# Patient Record
Sex: Female | Born: 1935 | Race: White | Hispanic: No | Smoking: Never smoker
Health system: Southern US, Community
[De-identification: ages and names within clinical notes are randomized; demographics above are authoritative.]

## PROBLEM LIST (undated history)

## (undated) DIAGNOSIS — F0390 Unspecified dementia without behavioral disturbance: Secondary | ICD-10-CM

## (undated) HISTORY — DX: Unspecified dementia, unspecified severity, without behavioral disturbance, psychotic disturbance, mood disturbance, and anxiety: F03.90

---

## 2015-08-23 ENCOUNTER — Ambulatory Visit (INDEPENDENT_AMBULATORY_CARE_PROVIDER_SITE_OTHER): Payer: Medicare Other | Admitting: Orthopaedic Surgery

## 2015-08-23 ENCOUNTER — Encounter: Payer: Self-pay | Admitting: Orthopaedic Surgery

## 2015-08-23 VITALS — BP 191/80 | HR 84 | Ht 64.0 in | Wt 142.8 lb

## 2015-08-23 DIAGNOSIS — S22000A Wedge compression fracture of unspecified thoracic vertebra, initial encounter for closed fracture: Secondary | ICD-10-CM

## 2015-08-23 DIAGNOSIS — M8448XA Pathological fracture, other site, initial encounter for fracture: Secondary | ICD-10-CM

## 2015-08-23 MED ORDER — HYDROCODONE-ACETAMINOPHEN 5-325 MG PO TABS: 1.0000 | ORAL_TABLET | Freq: Four times a day (QID) | ORAL | Status: DC | PRN

## 2015-08-23 NOTE — Patient Instructions (Addendum)
You have a prescription for a brace, take it to [facility] to get fitted for the brace She will wear the brace for 3 months

## 2015-08-23 NOTE — Progress Notes (Signed)
Patient Tracy Mccoy, female DOB:08-14-1922, 82 y.o. YNW:[id number]  Chief Complaint  Patient presents with  . New Patient (Initial Visit)    compression fracture of thoracic spine    HPI  Tracy Mccoy is a 82 y.o. female who fell out of bed two days ago.  She had immediate pain in the lower back area.  She was seen at Urgent Care.  I have the CD of the x-rays showing an acute compression fracture of T12.  She has no other injury, no weakness or numbness.  She hurts.   Back Pain This is a new problem. The current episode started in the past 7 days. The problem occurs constantly. The problem has been gradually worsening since onset. The pain is present in the thoracic spine. The quality of the pain is described as aching, burning, shooting and stabbing. The pain does not radiate. The pain is at a severity of 6/10. The pain is moderate. The pain is the same all the time. The symptoms are aggravated by bending, coughing, standing and twisting. She has tried bed rest for the symptoms. The treatment provided no relief.    Body mass index is 24.5 kg/(m^2).  Review of Systems  Patient does not have Diabetes Mellitus. Patient does not have hypertension. Patient does not have COPD or shortness of breath. Patient does not have BMI > 35. Patient does not have current smoking history.  Review of Systems  HENT: Positive for hearing loss.   Musculoskeletal: Positive for back pain.  Psychiatric/Behavioral: The patient is nervous/anxious.   All other systems reviewed and are negative.   Past Medical History  Diagnosis Date  . Dementia     History reviewed. No pertinent past surgical history.  History reviewed. No pertinent family history.  Social History Social History  Substance Use Topics  . Smoking status: Never Smoker   . Smokeless tobacco: None  . Alcohol Use: No    No Known Allergies  Current Outpatient Prescriptions  Medication Sig Dispense Refill  . donepezil (ARICEPT) 5  MG tablet Take 5 mg by mouth at bedtime.    Marland Kitchen HYDROcodone-acetaminophen (NORCO/VICODIN) 5-325 MG tablet Take 1 tablet by mouth every 6 (six) hours as needed for moderate pain. 60 tablet 0  . zolpidem (AMBIEN) 5 MG tablet Take 5 mg by mouth at bedtime as needed for sleep.     No current facility-administered medications for this visit.     Physical Exam  Blood pressure 191/80, pulse 84, height  (1.626 m), weight 142 lb 12.8 oz (64.774 kg).  Constitutional: overall normal hygiene, normal nutrition, well developed, normal grooming, normal body habitus. Assistive device:none  Musculoskeletal: gait and station Limp none, muscle tone and strength are normal, no tremors or atrophy is present.  .  Neurological: coordination overall normal.  Deep tendon reflex/nerve stretch intact.  Sensation normal.  Cranial nerves II-XII intact.   Skin:normal overall no scars, lesions, ulcers or rash es. No psoriasis.  Psychiatric: Alert and oriented x 3.  Recent memory intact, remote memory unclear.  Normal mood and affect. Well groomed.  Good eye contact.  Cardiovascular: overall no swelling, no varicosities, no edema bilaterally, normal temperatures of the legs and arms, no clubbing, cyanosis and good capillary refill.  Lymphatic: palpation is normal.   Extremities:she has no pain to the extremities but has pain in the mid back more at the end of the thoracic spine.  She has no spasm.  NV is intact.   Inspection pain of the mid  back Strength and tone normal Range of motion very limited of the back with 0 extension, 5 flexion and pain, lateral bend both sides cannot be done secondary to pain  Additional services performed: ordered a CASH brace for her.  I talked to her family members and her as to its use and application.  They will obtain this at a local pharmacy.  She is very hard of hearing and I made sure her family understood.  She is amazingly healthy for a person her age.  PLAN Call if any  problems.  Precautions discussed.  Continue current medications.   Return to clinic one week with examination of the CASH brace.  She may need new x-rays depending on how she is doing.

## 2015-08-27 ENCOUNTER — Emergency Department (HOSPITAL_COMMUNITY): Payer: Medicare Other

## 2015-08-27 ENCOUNTER — Inpatient Hospital Stay (HOSPITAL_COMMUNITY)
Admission: EM | Admit: 2015-08-27 | Discharge: 2015-09-01 | DRG: 193 | Disposition: A | Discharge status: Skilled Nursing Facility | Payer: Medicare Other | Attending: Internal Medicine | Admitting: Internal Medicine

## 2015-08-27 DIAGNOSIS — W19XXXD Unspecified fall, subsequent encounter: Secondary | ICD-10-CM

## 2015-08-27 DIAGNOSIS — W1830XA Fall on same level, unspecified, initial encounter: Secondary | ICD-10-CM | POA: Diagnosis present

## 2015-08-27 DIAGNOSIS — M4854XA Collapsed vertebra, not elsewhere classified, thoracic region, initial encounter for fracture: Secondary | ICD-10-CM | POA: Diagnosis present

## 2015-08-27 DIAGNOSIS — Y92002 Bathroom of unspecified non-institutional (private) residence single-family (private) house as the place of occurrence of the external cause: Secondary | ICD-10-CM

## 2015-08-27 DIAGNOSIS — M4854XS Collapsed vertebra, not elsewhere classified, thoracic region, sequela of fracture: Secondary | ICD-10-CM | POA: Diagnosis not present

## 2015-08-27 DIAGNOSIS — Y92009 Unspecified place in unspecified non-institutional (private) residence as the place of occurrence of the external cause: Secondary | ICD-10-CM | POA: Diagnosis not present

## 2015-08-27 DIAGNOSIS — R471 Dysarthria and anarthria: Secondary | ICD-10-CM | POA: Diagnosis present

## 2015-08-27 DIAGNOSIS — Z7982 Long term (current) use of aspirin: Secondary | ICD-10-CM

## 2015-08-27 DIAGNOSIS — G9340 Encephalopathy, unspecified: Secondary | ICD-10-CM | POA: Diagnosis not present

## 2015-08-27 DIAGNOSIS — T426X5A Adverse effect of other antiepileptic and sedative-hypnotic drugs, initial encounter: Secondary | ICD-10-CM | POA: Diagnosis present

## 2015-08-27 DIAGNOSIS — G92 Toxic encephalopathy: Secondary | ICD-10-CM | POA: Diagnosis present

## 2015-08-27 DIAGNOSIS — R9431 Abnormal electrocardiogram [ECG] [EKG]: Secondary | ICD-10-CM | POA: Diagnosis present

## 2015-08-27 DIAGNOSIS — M4854XG Collapsed vertebra, not elsewhere classified, thoracic region, subsequent encounter for fracture with delayed healing: Secondary | ICD-10-CM | POA: Diagnosis not present

## 2015-08-27 DIAGNOSIS — J189 Pneumonia, unspecified organism: Secondary | ICD-10-CM | POA: Diagnosis not present

## 2015-08-27 DIAGNOSIS — R4182 Altered mental status, unspecified: Secondary | ICD-10-CM | POA: Diagnosis not present

## 2015-08-27 DIAGNOSIS — R29810 Facial weakness: Secondary | ICD-10-CM | POA: Diagnosis present

## 2015-08-27 DIAGNOSIS — F0390 Unspecified dementia without behavioral disturbance: Secondary | ICD-10-CM | POA: Diagnosis present

## 2015-08-27 DIAGNOSIS — M4854XD Collapsed vertebra, not elsewhere classified, thoracic region, subsequent encounter for fracture with routine healing: Secondary | ICD-10-CM | POA: Diagnosis not present

## 2015-08-27 DIAGNOSIS — E876 Hypokalemia: Secondary | ICD-10-CM | POA: Diagnosis present

## 2015-08-27 DIAGNOSIS — I639 Cerebral infarction, unspecified: Secondary | ICD-10-CM

## 2015-08-27 DIAGNOSIS — F0391 Unspecified dementia with behavioral disturbance: Secondary | ICD-10-CM

## 2015-08-27 DIAGNOSIS — S22080A Wedge compression fracture of T11-T12 vertebra, initial encounter for closed fracture: Secondary | ICD-10-CM | POA: Diagnosis present

## 2015-08-27 DIAGNOSIS — W19XXXA Unspecified fall, initial encounter: Secondary | ICD-10-CM | POA: Diagnosis not present

## 2015-08-27 DIAGNOSIS — T391X5A Adverse effect of 4-Aminophenol derivatives, initial encounter: Secondary | ICD-10-CM | POA: Diagnosis present

## 2015-08-27 LAB — COMPREHENSIVE METABOLIC PANEL
ALK PHOS: 61 U/L (ref 38–126)
ALT: 24 U/L (ref 14–54)
ANION GAP: 13 (ref 5–15)
AST: 28 U/L (ref 15–41)
Albumin: 3.4 g/dL — ABNORMAL LOW (ref 3.5–5.0)
BILIRUBIN TOTAL: 0.3 mg/dL (ref 0.3–1.2)
BUN: 20 mg/dL (ref 6–20)
CALCIUM: 9.5 mg/dL (ref 8.9–10.3)
CO2: 27 mmol/L (ref 22–32)
Chloride: 105 mmol/L (ref 101–111)
Creatinine, Ser: 0.84 mg/dL (ref 0.44–1.00)
GFR calc non Af Amer: 59 mL/min — ABNORMAL LOW (ref >60)
Glucose, Bld: 79 mg/dL (ref 65–99)
Potassium: 3.7 mmol/L (ref 3.5–5.1)
Sodium: 145 mmol/L (ref 135–145)
TOTAL PROTEIN: 7.5 g/dL (ref 6.5–8.1)

## 2015-08-27 LAB — I-STAT TROPONIN, ED: Troponin i, poc: 0 ng/mL (ref 0.00–0.08)

## 2015-08-27 LAB — CBC WITH DIFFERENTIAL/PLATELET
Basophils Absolute: 0 K/uL (ref 0.0–0.1)
Basophils Relative: 0 %
Eosinophils Absolute: 0.3 K/uL (ref 0.0–0.7)
Eosinophils Relative: 5 %
HCT: 40.2 % (ref 36.0–46.0)
Hemoglobin: 12.8 g/dL (ref 12.0–15.0)
Lymphocytes Relative: 37 %
Lymphs Abs: 2.1 K/uL (ref 0.7–4.0)
MCH: 29.8 pg (ref 26.0–34.0)
MCHC: 31.8 g/dL (ref 30.0–36.0)
MCV: 93.7 fL (ref 78.0–100.0)
Monocytes Absolute: 0.8 K/uL (ref 0.1–1.0)
Monocytes Relative: 14 %
Neutro Abs: 2.4 K/uL (ref 1.7–7.7)
Neutrophils Relative %: 44 %
Platelets: 257 K/uL (ref 150–400)
RBC: 4.29 MIL/uL (ref 3.87–5.11)
RDW: 12.9 % (ref 11.5–15.5)
WBC: 5.6 K/uL (ref 4.0–10.5)

## 2015-08-27 LAB — URINALYSIS, ROUTINE W REFLEX MICROSCOPIC
Bilirubin Urine: NEGATIVE
Glucose, UA: NEGATIVE mg/dL
Hgb urine dipstick: NEGATIVE
KETONES UR: NEGATIVE mg/dL
LEUKOCYTES UA: NEGATIVE
NITRITE: NEGATIVE
PROTEIN: NEGATIVE mg/dL
Specific Gravity, Urine: 1.017 (ref 1.005–1.030)
pH: 5.5 (ref 5.0–8.0)

## 2015-08-27 MED ORDER — STROKE: EARLY STAGES OF RECOVERY BOOK
Freq: Once | Status: AC
  Administered 2015-08-28
  Filled 2015-08-27: qty 1

## 2015-08-27 MED ORDER — ASPIRIN EC 81 MG PO TBEC
81.0000 mg | DELAYED_RELEASE_TABLET | Freq: Every day | ORAL | Status: DC
  Administered 2015-08-28 – 2015-09-01 (×5): 81 mg via ORAL
  Filled 2015-08-27 (×5): qty 1

## 2015-08-27 MED ORDER — ENOXAPARIN SODIUM 40 MG/0.4ML ~~LOC~~ SOLN
40.0000 mg | Freq: Every day | SUBCUTANEOUS | Status: DC
  Administered 2015-08-28 – 2015-08-31 (×5): 40 mg via SUBCUTANEOUS
  Filled 2015-08-27 (×6): qty 0.4

## 2015-08-27 MED ORDER — DEXTROSE 5 % IV SOLN
1.0000 g | Freq: Once | INTRAVENOUS | Status: AC
  Administered 2015-08-27: 1 g via INTRAVENOUS
  Filled 2015-08-27: qty 10

## 2015-08-27 MED ORDER — HYDROCODONE-ACETAMINOPHEN 5-325 MG PO TABS
1.0000 | ORAL_TABLET | Freq: Four times a day (QID) | ORAL | Status: DC | PRN
  Administered 2015-08-28 (×4): 1 via ORAL
  Filled 2015-08-27 (×5): qty 1

## 2015-08-27 MED ORDER — DEXTROSE 5 % IV SOLN
500.0000 mg | Freq: Once | INTRAVENOUS | Status: AC
  Administered 2015-08-27: 500 mg via INTRAVENOUS
  Filled 2015-08-27: qty 500

## 2015-08-27 NOTE — ED Provider Notes (Signed)
CSN: [id number]     Arrival date & time 08/27/15  1944 History   First MD Initiated Contact with Patient 08/27/15 1958     Chief Complaint  Patient presents with  . Fall     (Consider location/radiation/quality/duration/timing/severity/associated sxs/prior Treatment) HPI Comments: Patient presents to the emergency department with chief complaint of failure to thrive. Unable to obtain history from patient secondary to dementia. Level V caveat applies. Reportedly, the patient had a mechanical fall on February 14. Since then, she has not been eating or drinking normally per the family.  The history is provided by the patient. No language interpreter was used.    Past Medical History  Diagnosis Date  . Dementia    No past surgical history on file. No family history on file. Social History  Substance Use Topics  . Smoking status: Never Smoker   . Smokeless tobacco: Not on file  . Alcohol Use: No   OB History    No data available     Review of Systems  Unable to perform ROS: Dementia      Allergies  Review of patient's allergies indicates no known allergies.  Home Medications   Prior to Admission medications   Medication Sig Start Date End Date Taking? Authorizing Provider  donepezil (ARICEPT) 5 MG tablet Take 5 mg by mouth at bedtime.    Historical Provider, MD  HYDROcodone-acetaminophen (NORCO/VICODIN) 5-325 MG tablet Take 1 tablet by mouth every 6 (six) hours as needed for moderate pain. 08/23/15   Darreld Mclean, MD  zolpidem (AMBIEN) 5 MG tablet Take 5 mg by mouth at bedtime as needed for sleep.    Historical Provider, MD   BP 179/74 mmHg  Pulse 77  Temp(Src) 98.5 F (36.9 C) (Rectal)  Resp 18  Wt 64.411 kg  SpO2 97% Physical Exam  Constitutional: She is oriented to person, place, and time. She appears well-developed and well-nourished.  HENT:  Head: Normocephalic and atraumatic.  Eyes: Conjunctivae and EOM are normal. Pupils are equal, round, and reactive to  light.  Neck: Normal range of motion. Neck supple.  Cardiovascular: Normal rate and regular rhythm.  Exam reveals no gallop and no friction rub.   No murmur heard. Pulmonary/Chest: Effort normal and breath sounds normal. No respiratory distress. She has no wheezes. She has no rales. She exhibits no tenderness.  Abdominal: Soft. Bowel sounds are normal. She exhibits no distension and no mass. There is no tenderness. There is no rebound and no guarding.  Musculoskeletal: Normal range of motion. She exhibits no edema or tenderness.  Neurological: She is alert and oriented to person, place, and time.  Skin: Skin is warm and dry.  Psychiatric: She has a normal mood and affect. Her behavior is normal. Judgment and thought content normal.  Nursing note and vitals reviewed.   ED Course  Procedures (including critical care time) Results for orders placed or performed during the hospital encounter of 08/27/15  CBC with Differential/Platelet  Result Value Ref Range   WBC 5.6 4.0 - 10.5 K/uL   RBC 4.29 3.87 - 5.11 MIL/uL   Hemoglobin 12.8 12.0 - 15.0 g/dL   HCT 56.2 13.0 - 86.5 %   MCV 93.7 78.0 - 100.0 fL   MCH 29.8 26.0 - 34.0 pg   MCHC 31.8 30.0 - 36.0 g/dL   RDW 78.4 69.6 - 29.5 %   Platelets 257 150 - 400 K/uL   Neutrophils Relative % 44 %   Neutro Abs 2.4 1.7 - 7.7  K/uL   Lymphocytes Relative 37 %   Lymphs Abs 2.1 0.7 - 4.0 K/uL   Monocytes Relative 14 %   Monocytes Absolute 0.8 0.1 - 1.0 K/uL   Eosinophils Relative 5 %   Eosinophils Absolute 0.3 0.0 - 0.7 K/uL   Basophils Relative 0 %   Basophils Absolute 0.0 0.0 - 0.1 K/uL  Comprehensive metabolic panel  Result Value Ref Range   Sodium 145 135 - 145 mmol/L   Potassium 3.7 3.5 - 5.1 mmol/L   Chloride 105 101 - 111 mmol/L   CO2 27 22 - 32 mmol/L   Glucose, Bld 79 65 - 99 mg/dL   BUN 20 6 - 20 mg/dL   Creatinine, Ser 4.09 0.44 - 1.00 mg/dL   Calcium 9.5 8.9 - 81.1 mg/dL   Total Protein 7.5 6.5 - 8.1 g/dL   Albumin 3.4 (L) 3.5  - 5.0 g/dL   AST 28 15 - 41 U/L   ALT 24 14 - 54 U/L   Alkaline Phosphatase 61 38 - 126 U/L   Total Bilirubin 0.3 0.3 - 1.2 mg/dL   GFR calc non Af Amer 59 (L) >60 mL/min   GFR calc Af Amer >60 >60 mL/min   Anion gap 13 5 - 15  Urinalysis, Routine w reflex microscopic (not at [facility])  Result Value Ref Range   Color, Urine YELLOW YELLOW   APPearance CLEAR CLEAR   Specific Gravity, Urine 1.017 1.005 - 1.030   pH 5.5 5.0 - 8.0   Glucose, UA NEGATIVE NEGATIVE mg/dL   Hgb urine dipstick NEGATIVE NEGATIVE   Bilirubin Urine NEGATIVE NEGATIVE   Ketones, ur NEGATIVE NEGATIVE mg/dL   Protein, ur NEGATIVE NEGATIVE mg/dL   Nitrite NEGATIVE NEGATIVE   Leukocytes, UA NEGATIVE NEGATIVE  I-Stat Troponin, ED (not at [facility])  Result Value Ref Range   Troponin i, poc 0.00 0.00 - 0.08 ng/mL   Comment 3           Ct Head Wo Contrast  08/27/2015  CLINICAL DATA:  Dementia. Patient fell on the fourteenth at home and since then has had decreased appetite. Altered mental status. EXAM: CT HEAD WITHOUT CONTRAST TECHNIQUE: Contiguous axial images were obtained from the base of the skull through the vertex without intravenous contrast. COMPARISON:  None. FINDINGS: Diffuse cerebral atrophy. Mild ventricular dilatation consistent with central atrophy. Low-attenuation changes in the deep white matter consistent with small vessel ischemia. Somewhat hazy appearance of the right posterior frontal sulci at the convexity. This may be due to motion artifact but early changes of acute infarct are not excluded. No mass effect or midline shift. No abnormal extra-axial fluid collections. Gray-white matter junctions are distinct. Basal cisterns are not effaced. No evidence of acute intracranial hemorrhage. No depressed skull fractures. Visualized paranasal sinuses and mastoid air cells are not opacified. Vascular calcifications. IMPRESSION: Chronic atrophy and small vessel ischemic changes. Nonspecific changes in the right posterior  frontal convexity may represent artifact but early changes of acute infarct are not excluded. No acute intracranial hemorrhage. No significant mass effect. Electronically Signed   By: Burman Nieves M.D.   On: 08/27/2015 21:28   Dg Chest Port 1 View  08/27/2015  CLINICAL DATA:  Patient fell February 14th at home with decreased appetite since then, current history of thoracic compression deformities EXAM: PORTABLE CHEST 1 VIEW COMPARISON:  08/23/2015 FINDINGS: Moderate cardiac enlargement with significant uncoiling of the aorta. Opacity right lower lobe suggesting consolidation and small effusion. Vascular pattern within normal limits.  No pleural effusion on the left. IMPRESSION: Right lower lobe airspace disease with small effusion. Pneumonia or pneumonitis suspected. Electronically Signed   By: Esperanza Heir M.D.   On: 08/27/2015 21:04    I have personally reviewed and evaluated these images and lab results as part of my medical decision-making.    MDM   Final diagnoses:  CAP (community acquired pneumonia)    Patient with reported altered mental status. Per the patient's son, she has not been communicating like normal. He has noticed this since she had a fall around Valentines, in which she had a T12 compression fracture. Will check labs, CT head, and will reassess.  Chest x-ray today remarkable for a right lower lobe pneumonia. Will treat for community-acquired pneumonia. Will admit to hospitalist service.  Appreciate Dr. Katrinka Blazing for admitting the patient.  Patient seen by and discussed with Dr. Verdie Mosher, who agrees with the plan.    Roxy Horseman, PA-C 08/27/15 2203  Lavera Guise, MD 08/28/15 [phone]

## 2015-08-27 NOTE — ED Notes (Addendum)
Pt arrives via [name], per report, the pt has dementia, lives alone with family who checks on her.  Pt fell on the 14th at home, and since then she has had decreased appetite/oral intake. She was seen at her doctor, has thoracic vertebrae compression fractures, pt is suppose to be wearing a brace for 3 months. Pt denied pain en route, family administered hydrocodone 3 hours PTA. Pt alert on arrival.

## 2015-08-27 NOTE — ED Notes (Signed)
Patient transported to CT 

## 2015-08-27 NOTE — ED Notes (Signed)
PA at bedside, speaking with pt son who is at the bedside.

## 2015-08-27 NOTE — ED Notes (Signed)
Delay in lab draw pt in exray 

## 2015-08-27 NOTE — ED Notes (Signed)
cxr at bedside

## 2015-08-27 NOTE — H&P (Addendum)
[facility] History and Physical  Neeva Trew ZOX:[id number] DOB: June 07, 1923 DOA: 08/27/2015  Referring physician: ED PCP: No primary care provider on file.   Chief Complaint: Altered  HPI:  Ms. Monfort is a 82 year old female with a past medical history significant for dementia; who presents with complaints of being altered. Patient's son gives history as she has significant dementia. Patient was noted to fall 5 days ago after missing the toilet will try to use the bathroom. She was taken to  a urgent care in which x-rays showed an acute compression fracture of T 12. She suffered no other injuries and was discharged home and given a back brace with pain medications. He notes that trying to move her anywhere causes her a great deal of back pain. Thereafter family noted that she has been basically bedbound. Family stated that her speech has been off and she was not acting like herself. Son shows video of the patient brushing her tongue with a hair brush. at baseline patient will answer questions appropriately if kept short.   Yesterday that her right side of her mouth seemed to be drooping, but it appeared to go away. Patient had been started on new medications including Donepeizil started approximately 2 weeks ago by Dr. Quentin Cornwall. Subsequently, after the fall the patient was started on hydrocodone and sober then she had not been sleeping well. Son denies any significant cough or any other complaints.   Upon admission the patient was evaluated with lab work which was unremarkable. Urinalysis negative, CT of the brain showed question of possible artifact versus acute infarct in the right frontal lobe, chest x-ray showing right lower lobe air space disease with a small effusion question possibility of pneumonia.   Review of Systems: negative for the following except for as noted above. Note patient has a caveat of dementia. Constitutional: Denies fever, chills, diaphoresis, appetite change and fatigue.   HEENT: Denies photophobia, eye pain, redness, hearing loss, ear pain, congestion, sore throat, rhinorrhea, sneezing, mouth sores, trouble swallowing, neck pain, neck stiffness and tinnitus.  Respiratory: Denies SOB, DOE, cough, chest tightness, and wheezing.  Cardiovascular: Denies chest pain, palpitations and leg swelling.  Gastrointestinal: Denies nausea, vomiting, abdominal pain, diarrhea, constipation, blood in stool and abdominal distention.  Genitourinary: Denies dysuria, urgency, frequency, hematuria, flank pain and difficulty urinating.  Musculoskeletal: Denies myalgias, joint swelling, and arthralgias.  Skin: Denies pallor, rash and wound.  Neurological: Denies dizziness, seizures, syncope, weakness, light-headedness, numbness and headaches.  Hematological: Denies adenopathy. Easy bruising, personal or family bleeding history  Psychiatric/behavior :   Past Medical History  Diagnosis Date  . Dementia      No past surgical history on file.    Social History:  reports that she has never smoked. She does not have any smokeless tobacco history on file. She reports that she does not drink alcohol or use illicit drugs. Where does patient live--home   and with whom if at home? alone prior to recent fall now a cousin staying with her  Can patient participate in ADLs? currently needing assistance  No Known Allergies  No family history on file.      Prior to Admission medications   Medication Sig Start Date End Date Taking? Authorizing Provider  aspirin EC 81 MG tablet Take 81 mg by mouth daily.   Yes Historical Provider, MD  donepezil (ARICEPT) 5 MG tablet Take 5 mg by mouth at bedtime.   Yes Historical Provider, MD  HYDROcodone-acetaminophen (NORCO/VICODIN) 5-325 MG tablet Take 1  tablet by mouth every 6 (six) hours as needed for moderate pain. 08/23/15  Yes Darreld Mclean, MD  Multiple Vitamin (MULTIVITAMIN WITH MINERALS) TABS tablet Take 1 tablet by mouth daily.   Yes  Historical Provider, MD  zolpidem (AMBIEN) 5 MG tablet Take 5 mg by mouth at bedtime as needed for sleep.   Yes Historical Provider, MD     Physical Exam: Filed Vitals:   08/27/15 2136 08/27/15 2200 08/27/15 2230 08/27/15 2336  BP: 171/75 157/85 128/57 164/66  Pulse: 70 74 73 76  Temp:    97.9 F (36.6 C)  TempSrc:    Axillary  Resp: Weight:      SpO2: 97% 98% 99% 97%     Constitutional: Vital signs reviewed. Patient is a well-developed and well-nourished in no acute distress and cooperative with exam with repeated redirecting. Alert, but appears somewhat confused. Head: Normocephalic and atraumatic  Ear: TM normal bilaterally  Mouth: no erythema or exudates, MMM  Eyes: PERRL, EOMI, conjunctivae normal, No scleral icterus.  Neck: Supple, Trachea midline normal ROM, No JVD, mass, thyromegaly, or carotid bruit present.  Cardiovascular: RRR, S1 normal, S2 normal, no MRG, pulses symmetric and intact bilaterally  Pulmonary/Chest: CTAB, no wheezes, rales, or rhonchi  Abdominal: Soft. Non-tender, non-distended, bowel sounds are normal, no masses, organomegaly, or guarding present.  GU: no CVA tenderness Musculoskeletal: Tenderness to palpation of the lower thoracic area back Ext: no edema and no cyanosis, pulses palpable bilaterally (DP and PT)  Hematology: no cervical, inginal, or axillary adenopathy.  Neurological: able to move all extremities subtle signs of right-sided facial droop Skin: Warm, dry and intact. No rash, cyanosis, or clubbing.  Psychiatric: Appears pleasantly confused      Data Review   Micro Results No results found for this or any previous visit (from the past 240 hour(s)).  Radiology Reports Ct Head Wo Contrast  08/27/2015  CLINICAL DATA:  Dementia. Patient fell on the fourteenth at home and since then has had decreased appetite. Altered mental status. EXAM: CT HEAD WITHOUT CONTRAST TECHNIQUE: Contiguous axial images were obtained from the base  of the skull through the vertex without intravenous contrast. COMPARISON:  None. FINDINGS: Diffuse cerebral atrophy. Mild ventricular dilatation consistent with central atrophy. Low-attenuation changes in the deep white matter consistent with small vessel ischemia. Somewhat hazy appearance of the right posterior frontal sulci at the convexity. This may be due to motion artifact but early changes of acute infarct are not excluded. No mass effect or midline shift. No abnormal extra-axial fluid collections. Gray-white matter junctions are distinct. Basal cisterns are not effaced. No evidence of acute intracranial hemorrhage. No depressed skull fractures. Visualized paranasal sinuses and mastoid air cells are not opacified. Vascular calcifications. IMPRESSION: Chronic atrophy and small vessel ischemic changes. Nonspecific changes in the right posterior frontal convexity may represent artifact but early changes of acute infarct are not excluded. No acute intracranial hemorrhage. No significant mass effect. Electronically Signed   By: Burman Nieves M.D.   On: 08/27/2015 21:28   Dg Chest Port 1 View  08/27/2015  CLINICAL DATA:  Patient fell February 14th at home with decreased appetite since then, current history of thoracic compression deformities EXAM: PORTABLE CHEST 1 VIEW COMPARISON:  08/23/2015 FINDINGS: Moderate cardiac enlargement with significant uncoiling of the aorta. Opacity right lower lobe suggesting consolidation and small effusion. Vascular pattern within normal limits. No pleural effusion on the left. IMPRESSION: Right lower lobe airspace disease with small effusion. Pneumonia  or pneumonitis suspected. Electronically Signed   By: Esperanza Heir M.D.   On: 08/27/2015 21:04     CBC  Recent Labs Lab 08/27/15 2038  WBC 5.6  HGB 12.8  HCT 40.2  PLT 257  MCV 93.7  MCH 29.8  MCHC 31.8  RDW 12.9  LYMPHSABS 2.1  MONOABS 0.8  EOSABS 0.3  BASOSABS 0.0    Chemistries   Recent Labs Lab  08/27/15 2038  NA 145  K 3.7  CL 105  CO2 27  GLUCOSE 79  BUN 20  CREATININE 0.84  CALCIUM 9.5  AST 28  ALT 24  ALKPHOS 61  BILITOT 0.3   ------------------------------------------------------------------------------------------------------------------ estimated creatinine clearance is 38.4 mL/min (by C-G formula based on Cr of 0.84). ------------------------------------------------------------------------------------------------------------------ No results for input(s): HGBA1C in the last 72 hours. ------------------------------------------------------------------------------------------------------------------ No results for input(s): CHOL, HDL, LDLCALC, TRIG, CHOLHDL, LDLDIRECT in the last 72 hours. ------------------------------------------------------------------------------------------------------------------ No results for input(s): TSH, T4TOTAL, T3FREE, THYROIDAB in the last 72 hours.  Invalid input(s): FREET3 ------------------------------------------------------------------------------------------------------------------ No results for input(s): VITAMINB12, FOLATE, FERRITIN, TIBC, IRON, RETICCTPCT in the last 72 hours.  Coagulation profile No results for input(s): INR, PROTIME in the last 168 hours.  No results for input(s): DDIMER in the last 72 hours.  Cardiac Enzymes No results for input(s): CKMB, TROPONINI, MYOGLOBIN in the last 168 hours.  Invalid input(s): CK ------------------------------------------------------------------------------------------------------------------ Invalid input(s): POCBNP   CBG: No results for input(s): GLUCAP in the last 168 hours.     EKG: Independently reviewed. sinus rhythm with low voltage minimum ST elevations in inferior leads   Assessment/Plan Acute encephalopathy: This is new per the family patient is acting oddly and has had a decline since recent fall. Chest x-ray signs of a possible right lower lobe pneumonia.  Workup otherwise negative except for CT which does not coincide with the right facial droop although note of rectus patient was moving. Question combination of recent meds including hydrocodone, Zolpidem, and donepezil being contributing factors. - Admit to a telemetry bed  - Neuro checks every 2 hours  - MRI/ MRA of the brain;  if positive consult neurology at that time. Spoke with Dr. Petra Kuba regarding patient and this was his recommendation. - Hold donepezil and Zolpidem  Possible CAP (community acquired pneumonia): As seen above. Patient not noted to have any cough, fever, or signs specific for acute infection. Chest x-ray revealed possible right lower lung airspace disease with a effusion question possibility of pneumonia. Patient has been less ambulatory since fall versus possible aspiration with patient's dysarthria. - Try to obtain sputum cultures  - empiric antibiotics of ceftriaxone and azithromycin    Dysarthria: Question if patient may have had TIA/stroke. Initial CT appears to be negative. Based off history patient would likely be out of the window for intervention. - Speech therapy to eval and treat   Compression fracture of T12 secondary to fall - PT/OT to eval and treat  - Continue hydrocodone  Dementia - Held Donepezil   Code Status:   full Family Communication: bedside Disposition Plan: admit   Total time spent 55 minutes.Greater than 50% of this time was spent in counseling, explanation of diagnosis, planning of further management, and coordination of care  Clydie Braun [facility] Pager [phone]  If 7PM-7AM, please contact night-coverage [email or web address] Password [facility] 08/27/2015, 11:38 PM

## 2015-08-28 ENCOUNTER — Inpatient Hospital Stay (HOSPITAL_COMMUNITY): Payer: Medicare Other

## 2015-08-28 ENCOUNTER — Encounter (HOSPITAL_COMMUNITY): Payer: Self-pay

## 2015-08-28 DIAGNOSIS — S22080A Wedge compression fracture of T11-T12 vertebra, initial encounter for closed fracture: Secondary | ICD-10-CM | POA: Diagnosis present

## 2015-08-28 DIAGNOSIS — R471 Dysarthria and anarthria: Secondary | ICD-10-CM | POA: Diagnosis present

## 2015-08-28 DIAGNOSIS — M4854XG Collapsed vertebra, not elsewhere classified, thoracic region, subsequent encounter for fracture with delayed healing: Secondary | ICD-10-CM

## 2015-08-28 DIAGNOSIS — F0390 Unspecified dementia without behavioral disturbance: Secondary | ICD-10-CM | POA: Diagnosis present

## 2015-08-28 DIAGNOSIS — W19XXXA Unspecified fall, initial encounter: Secondary | ICD-10-CM | POA: Diagnosis not present

## 2015-08-28 LAB — CBC WITH DIFFERENTIAL/PLATELET
Basophils Absolute: 0 10*3/uL (ref 0.0–0.1)
Basophils Relative: 1 %
EOS PCT: 4 %
Eosinophils Absolute: 0.2 10*3/uL (ref 0.0–0.7)
HEMATOCRIT: 37.5 % (ref 36.0–46.0)
HEMOGLOBIN: 12.3 g/dL (ref 12.0–15.0)
LYMPHS ABS: 1.6 10*3/uL (ref 0.7–4.0)
LYMPHS PCT: 28 %
MCH: 30 pg (ref 26.0–34.0)
MCHC: 32.8 g/dL (ref 30.0–36.0)
MCV: 91.5 fL (ref 78.0–100.0)
Monocytes Absolute: 0.9 10*3/uL (ref 0.1–1.0)
Monocytes Relative: 15 %
NEUTROS ABS: 3.1 10*3/uL (ref 1.7–7.7)
Neutrophils Relative %: 52 %
PLATELETS: 261 10*3/uL (ref 150–400)
RBC: 4.1 MIL/uL (ref 3.87–5.11)
RDW: 12.6 % (ref 11.5–15.5)
WBC: 5.8 10*3/uL (ref 4.0–10.5)

## 2015-08-28 LAB — BASIC METABOLIC PANEL
ANION GAP: 10 (ref 5–15)
BUN: 15 mg/dL (ref 6–20)
CALCIUM: 8.5 mg/dL — AB (ref 8.9–10.3)
CO2: 25 mmol/L (ref 22–32)
CREATININE: 0.61 mg/dL (ref 0.44–1.00)
Chloride: 107 mmol/L (ref 101–111)
GFR calc non Af Amer: >60 mL/min (ref >60)
GLUCOSE: 94 mg/dL (ref 65–99)
Potassium: 3.3 mmol/L — ABNORMAL LOW (ref 3.5–5.1)
Sodium: 142 mmol/L (ref 135–145)

## 2015-08-28 LAB — LIPID PANEL
CHOL/HDL RATIO: 4.1 ratio
Cholesterol: 209 mg/dL — ABNORMAL HIGH (ref 0–200)
HDL: 51 mg/dL (ref >40)
LDL CALC: 143 mg/dL — AB (ref 0–99)
Triglycerides: 74 mg/dL (ref <150)
VLDL: 15 mg/dL (ref 0–40)

## 2015-08-28 LAB — TROPONIN I
Troponin I: <0.03 ng/mL (ref <0.031)
Troponin I: <0.03 ng/mL (ref <0.031)

## 2015-08-28 LAB — VITAMIN B12: Vitamin B-12: 801 pg/mL (ref 180–914)

## 2015-08-28 LAB — TSH: TSH: 0.93 u[IU]/mL (ref 0.350–4.500)

## 2015-08-28 MED ORDER — CEFTRIAXONE SODIUM 2 G IJ SOLR
2.0000 g | INTRAMUSCULAR | Status: DC
  Administered 2015-08-28 – 2015-09-01 (×5): 2 g via INTRAVENOUS
  Filled 2015-08-28 (×5): qty 2

## 2015-08-28 MED ORDER — DONEPEZIL HCL 5 MG PO TABS
5.0000 mg | ORAL_TABLET | Freq: Every day | ORAL | Status: DC
  Administered 2015-08-28 – 2015-08-31 (×4): 5 mg via ORAL
  Filled 2015-08-28 (×5): qty 1

## 2015-08-28 MED ORDER — ALPRAZOLAM 0.5 MG PO TABS
0.5000 mg | ORAL_TABLET | Freq: Once | ORAL | Status: AC
  Administered 2015-08-28: 0.5 mg via ORAL
  Filled 2015-08-28: qty 1

## 2015-08-28 MED ORDER — HALOPERIDOL LACTATE 5 MG/ML IJ SOLN
2.5000 mg | Freq: Once | INTRAMUSCULAR | Status: DC
  Filled 2015-08-28: qty 1

## 2015-08-28 MED ORDER — POTASSIUM CHLORIDE CRYS ER 20 MEQ PO TBCR
20.0000 meq | EXTENDED_RELEASE_TABLET | Freq: Once | ORAL | Status: AC
  Administered 2015-08-28: 20 meq via ORAL
  Filled 2015-08-28: qty 1

## 2015-08-28 MED ORDER — DEXTROSE 5 % IV SOLN
500.0000 mg | INTRAVENOUS | Status: DC
  Administered 2015-08-28 – 2015-08-29 (×2): 500 mg via INTRAVENOUS
  Filled 2015-08-28 (×2): qty 500

## 2015-08-28 MED ORDER — LORAZEPAM 2 MG/ML IJ SOLN
0.5000 mg | Freq: Once | INTRAMUSCULAR | Status: AC
  Administered 2015-08-28: 0.5 mg via INTRAVENOUS
  Filled 2015-08-28: qty 1

## 2015-08-28 MED ORDER — SODIUM CHLORIDE 0.9 % IV SOLN
INTRAVENOUS | Status: DC
  Administered 2015-08-28: 04:00:00 via INTRAVENOUS

## 2015-08-28 MED ORDER — POTASSIUM CHLORIDE 2 MEQ/ML IV SOLN
INTRAVENOUS | Status: AC
  Administered 2015-08-28: 12:00:00 via INTRAVENOUS
  Filled 2015-08-28: qty 1000

## 2015-08-28 NOTE — Progress Notes (Signed)
RN swallow test completed. Pt. Able to swallow applesauce and pill, but kept in front of her mouth for a while before swallowing. Speech Evaluation ordered.

## 2015-08-28 NOTE — Progress Notes (Signed)
Pharmacy Antibiotic Note  Tracy Mccoy is a 82 y.o. female admitted on 08/27/2015 with pneumonia.  Pharmacy has been consulted for Ceftriaxone, Azithromycin dosing.  Plan: Azithromycin  iv q24hr Ceftriaxone 2gm iv q24hr  Weight: 142 lb (64.411 kg)  Temp (24hrs), Avg:98.2 F (36.8 C), Min:97.9 F (36.6 C), Max:98.5 F (36.9 C)   Recent Labs Lab 08/27/15 2038  WBC 5.6  CREATININE 0.84    Estimated Creatinine Clearance: 38.4 mL/min (by C-G formula based on Cr of 0.84).    No Known Allergies  Antimicrobials this admission: Ceftriaxone 2/18 >>  Azithromycin 2/18 >>   Thank you for allowing pharmacy to be a part of this patient's care.  Aleene Davidson Crowford 08/28/2015 6:11 AM

## 2015-08-28 NOTE — Progress Notes (Signed)
Pt. very restless and only able to redirect for short minute. Trying to get OOB, agitated, and unable to sleep and per family has not slept since yesterday. This family states that this behavior is distressing to them. Pt. also kicked the tech in MRI, but not combative to staff on the unit. MD notified, no new orders received.

## 2015-08-28 NOTE — Progress Notes (Signed)
SLP Cancellation Note  Patient Details Name: Racquel Arkin MRN: [id number] DOB: 05-16-1923   Cancelled treatment:       Reason Eval/Treat Not Completed:  Clinical swallowing evaluation was attempted.  The patient became extremely agitated when presented with any PO's actually swatting the spoon out of the therapist's hand.  Unable to complete swallowing evaluation today.  Will follow up next date.  Nurse is aware.    Dimas Aguas, MA, CCC-SLP Acute Rehab SLP [phone] [place] 08/28/2015, 2:31 PM

## 2015-08-28 NOTE — Progress Notes (Signed)
PROGRESS NOTE  Tracy Mccoy ZOX:[id number] DOB: 1923-05-30 DOA: 08/27/2015 PCP: No primary care provider on file. Brief History 82 year old female with a history of dementia presented with1-2 day history of worsening confusion. Approximately 5 days prior to admission, the patient had a mechanical fall going to the bathroom resulting in a T12 compression fracture. She was Prescribed a back brace and hydrocodone.n addition, the patient was started on donepezil approximately 2 weeks prior to admission by Dr. Karilyn Cota.  She was also started on zolpidem because she was not sleeping well after her fall.  Workup in the emergency department revealed right lower lobe infiltrate with small right effusion. Urinalysis was negative for pyuria. CT of the brain showed a nonspecific right posterior frontal convexity abnormality versus artifact. In addition, the family stated that they noted some  Right facial droop on the morning of 08/27/2015.  Assessment/Plan: Acute encephalopathy -Multifactorial secondary to infectious process and medications (Norco and zolpidem) -MRI brain rule out stroke -Continue aspirin -Continue antibiotics -Judicious opioids for pain -B12, TSH -son at bedside states she is a little better than yesterday, but still confused CAP -Continue ceftriaxone and azithromycin -Presently stable on room air -afebrile and hemodynamically stable -monitor for signs of aspiration Dysarthria -MRI brain -Speech therapy evaluation -continue aspirin T12 compression fracture -Judicious opioids -PT/OT Abnormal EKG -Minimal ST elevations II, III, avF -no chest pain -cycle troponins Dementia -continue Aricept Hypokalemia -Replete -Check magnesium    Family Communication:   [name] updated at beside Disposition Plan:   Home when medically stable      Procedures/Studies: Ct Head Wo Contrast  08/27/2015  CLINICAL DATA:  Dementia. Patient fell on the fourteenth at home and  since then has had decreased appetite. Altered mental status. EXAM: CT HEAD WITHOUT CONTRAST TECHNIQUE: Contiguous axial images were obtained from the base of the skull through the vertex without intravenous contrast. COMPARISON:  None. FINDINGS: Diffuse cerebral atrophy. Mild ventricular dilatation consistent with central atrophy. Low-attenuation changes in the deep white matter consistent with small vessel ischemia. Somewhat hazy appearance of the right posterior frontal sulci at the convexity. This may be due to motion artifact but early changes of acute infarct are not excluded. No mass effect or midline shift. No abnormal extra-axial fluid collections. Gray-white matter junctions are distinct. Basal cisterns are not effaced. No evidence of acute intracranial hemorrhage. No depressed skull fractures. Visualized paranasal sinuses and mastoid air cells are not opacified. Vascular calcifications. IMPRESSION: Chronic atrophy and small vessel ischemic changes. Nonspecific changes in the right posterior frontal convexity may represent artifact but early changes of acute infarct are not excluded. No acute intracranial hemorrhage. No significant mass effect. Electronically Signed   By: Burman Nieves M.D.   On: 08/27/2015 21:28   Dg Chest Port 1 View  08/27/2015  CLINICAL DATA:  Patient fell February 14th at home with decreased appetite since then, current history of thoracic compression deformities EXAM: PORTABLE CHEST 1 VIEW COMPARISON:  08/23/2015 FINDINGS: Moderate cardiac enlargement with significant uncoiling of the aorta. Opacity right lower lobe suggesting consolidation and small effusion. Vascular pattern within normal limits. No pleural effusion on the left. IMPRESSION: Right lower lobe airspace disease with small effusion. Pneumonia or pneumonitis suspected. Electronically Signed   By: Esperanza Heir M.D.   On: 08/27/2015 21:04         Subjective: Patient is pleasantly confused, but denies any  fevers, chills, chest pain, shortness breath, vomiting, diarrhea. No abdominal pain.  Objective: Filed Vitals:   08/27/15 2200 08/27/15 2230 08/27/15 2336 08/28/15 0622  BP: 157/85 128/57 164/66 171/64  Pulse: 74 73 76 72  Temp:   97.9 F (36.6 C) 97.6 F (36.4 C)  TempSrc:   Axillary Axillary  Resp: Weight:      SpO2: 98% 99% 97% 96%   No intake or output data in the 24 hours ending 08/28/15 0924 Weight change:  Exam:   General:  Pt is alert, follows commands appropriately, not in acute distress  HEENT: No icterus, No thrush, No neck mass, [place]/AT  Cardiovascular: RRR, S1/S2, no rubs, no gallops  Respiratory: CTA bilaterally, no wheezing, no crackles, no rhonchi  Abdomen: Soft/+BS, non tender, non distended, no guarding  Extremities: No edema, No lymphangitis, No petechiae, No rashes, no synovitis  Data Reviewed: Basic Metabolic Panel:  Recent Labs Lab 08/27/15 2038 08/28/15 0632  NA 145 142  K 3.7 3.3*  CL 105 107  CO2 27 25  GLUCOSE 79 94  BUN 20 15  CREATININE 0.84 0.61  CALCIUM 9.5 8.5*   Liver Function Tests:  Recent Labs Lab 08/27/15 2038  AST 28  ALT 24  ALKPHOS 61  BILITOT 0.3  PROT 7.5  ALBUMIN 3.4*   No results for input(s): LIPASE, AMYLASE in the last 168 hours. No results for input(s): AMMONIA in the last 168 hours. CBC:  Recent Labs Lab 08/27/15 2038 08/28/15 0632  WBC 5.6 5.8  NEUTROABS 2.4 3.1  HGB 12.8 12.3  HCT 40.2 37.5  MCV 93.7 91.5  PLT 257 261   Cardiac Enzymes: No results for input(s): CKTOTAL, CKMB, CKMBINDEX, TROPONINI in the last 168 hours. BNP: Invalid input(s): POCBNP CBG: No results for input(s): GLUCAP in the last 168 hours.  No results found for this or any previous visit (from the past 240 hour(s)).   Scheduled Meds: . aspirin EC  81 mg Oral Daily  . azithromycin  500 mg Intravenous Q24H  . cefTRIAXone (ROCEPHIN)  IV  2 g Intravenous Q24H  . enoxaparin (LOVENOX) injection  40 mg  Subcutaneous QHS   Continuous Infusions: . sodium chloride 75 mL/hr at 08/28/15 0402     Garhett Bernhard, DO  [facility] Pager [phone]  If 7PM-7AM, please contact night-coverage [email or web address] Password [email or web address] 08/28/2015, 9:24 AM   LOS: 1 day

## 2015-08-28 NOTE — Progress Notes (Signed)
PHARMACY NOTE  Pharmacy has been assisting with dosing of Rocephin and Azithromycin for pneumonia. Dosages remain stable as ordered and the need for further dosage adjustment appears unlikely at present.    Will sign off at this time.  Please reconsult if a change in clinical status warrants re-evaluation of dosage.  Charolotte Eke, PharmD, pager [phone]. 08/28/2015,1:35 PM.

## 2015-08-28 NOTE — Progress Notes (Signed)
PT Cancellation Note  Patient Details Name: Tracy Mccoy MRN: [id number] DOB: Feb 09, 1923   Cancelled Treatment:     PT eval attempted this am.  Per RN, pt prepping for MRI.  Will follow.   Tzivia Oneil 08/28/2015, 9:05 AM

## 2015-08-28 NOTE — Progress Notes (Signed)
OT Cancellation Note  Patient Details Name: Tracy Mccoy MRN: [id number] DOB: 1922/08/09   Cancelled Treatment:    Reason Eval/Treat Not Completed: Patient at procedure or test/ unavailable  Tracy Mccoy, [place] 08/28/2015, 11:05 AM

## 2015-08-29 LAB — BASIC METABOLIC PANEL
Anion gap: 11 (ref 5–15)
BUN: 11 mg/dL (ref 6–20)
CHLORIDE: 106 mmol/L (ref 101–111)
CO2: 23 mmol/L (ref 22–32)
CREATININE: 0.62 mg/dL (ref 0.44–1.00)
Calcium: 8.9 mg/dL (ref 8.9–10.3)
GFR calc Af Amer: >60 mL/min (ref >60)
GFR calc non Af Amer: >60 mL/min (ref >60)
GLUCOSE: 101 mg/dL — AB (ref 65–99)
Potassium: 3.8 mmol/L (ref 3.5–5.1)
Sodium: 140 mmol/L (ref 135–145)

## 2015-08-29 LAB — HEMOGLOBIN A1C
HEMOGLOBIN A1C: 5.8 % — AB (ref 4.8–5.6)
Mean Plasma Glucose: 120 mg/dL

## 2015-08-29 LAB — MAGNESIUM: Magnesium: 1.8 mg/dL (ref 1.7–2.4)

## 2015-08-29 MED ORDER — ALPRAZOLAM 0.25 MG PO TABS
0.2500 mg | ORAL_TABLET | Freq: Once | ORAL | Status: AC
  Administered 2015-08-29: 0.25 mg via ORAL
  Filled 2015-08-29: qty 1

## 2015-08-29 NOTE — Evaluation (Signed)
Occupational Therapy Evaluation Patient Details Name: Tracy Mccoy MRN: [id number] DOB: 02-Mar-1923 Today's Date: 08/29/2015    History of Present Illness 82 year old female with a history of dementia presented with1-2 day history of worsening confusion. Approximately 5 days prior to admission, the patient had a mechanical fall going to the bathroom resulting in a T12 compression fracture. She was Prescribed a back brace and hydrocodone.n addition, the patient was started on donepezil approximately 2 weeks prior to admission by Dr. Karilyn Cota. She was also started on zolpidem because she was not sleeping well after her fall. Workup in the emergency department revealed right lower lobe infiltrate with small right effusion. Urinalysis was negative for pyuria. CT of the brain showed a nonspecific right posterior frontal convexity abnormality versus artifact. In addition, the family stated that they noted some Right facial droop on the morning of 08/27/2015.   Clinical Impression   Pt admitted with confusion. Pt currently with functional limitations due to the deficits listed below (see OT Problem List).  Pt will benefit from skilled OT to increase their safety and independence with ADL and functional mobility for ADL to facilitate discharge to venue listed below.      Follow Up Recommendations  SNF    Equipment Recommendations  None recommended by OT       Precautions / Restrictions Precautions Precautions: Back Precaution Comments: per note pt has a back brace. OT did not see brace in room.  PT aware and will ask sons about brace. Pt did get up to chair without brace.  Brace not listed in orders. OT will also follow up regarding brace. Restrictions Weight Bearing Restrictions: No      Mobility Bed Mobility Overal bed mobility: Needs Assistance Bed Mobility: Supine to Sit     Supine to sit: Mod assist        Transfers Overall transfer level: Needs assistance Equipment used: 2  person hand held assist Transfers: Sit to/from Stand;Stand Pivot Transfers Sit to Stand: +2 safety/equipment;Mod assist;Max assist Stand pivot transfers: +2 safety/equipment;Mod assist;Max assist                 ADL Overall ADL's : Needs assistance/impaired     Grooming: Minimal assistance;Brushing hair;Sitting                   Toilet Transfer: +2 for physical assistance;+2 for safety/equipment;Moderate assistance Toilet Transfer Details (indicate cue type and reason): bed to chair- hand held A Toileting- Clothing Manipulation and Hygiene: Maximal assistance;Sit to/from stand;+2 for physical assistance;+2 for safety/equipment         General ADL Comments: pt will need ST SNF.  Sons report pts fiesty behavior is not her baseline- she is usually very sweet.                 Pertinent Vitals/Pain Pain Assessment: Faces Faces Pain Scale: Hurts a little bit Pain Location: mid back area Pain Descriptors / Indicators: Sore Pain Intervention(s): Monitored during session;Repositioned;Limited activity within patient's tolerance        Extremity/Trunk Assessment Upper Extremity Assessment Upper Extremity Assessment: Generalized weakness           Communication   Pt HOH  Cognition Arousal/Alertness: Awake/alert Behavior During Therapy: Impulsive;Restless Overall Cognitive Status: Impaired/Different from baseline Area of Impairment: Orientation;Attention;Safety/judgement;Awareness               General Comments: pt also HOH   General Comments  Home Living Family/patient expects to be discharged to:: Skilled nursing facility                                             OT Diagnosis: Generalized weakness;Cognitive deficits   OT Problem List: Decreased strength;Decreased activity tolerance;Decreased safety awareness   OT Treatment/Interventions: Self-care/ADL training;DME and/or AE instruction;Patient/family  education    OT Goals(Current goals can be found in the care plan section) Acute Rehab OT Goals Patient Stated Goal: son states they want rehab for pt  OT Goal Formulation: With patient/family Time For Goal Achievement: 09/12/15 Potential to Achieve Goals: Good  OT Frequency: Min 2X/week   Barriers to D/C: Decreased caregiver support             End of Session Nurse Communication: Mobility status  Activity Tolerance: Patient tolerated treatment well Patient left: in chair;with call bell/phone within reach;with chair alarm set;with family/visitor present;with nursing/sitter in room   Time: 1610-9604 OT Time Calculation (min): 26 min Charges:  OT General Charges $OT Visit: 1 Procedure OT Evaluation $OT Eval Low Complexity: 1 Procedure OT Treatments $Self Care/Home Management : 8-22 mins G-Codes:    Alba Cory 09-Sep-2015, 2:08 PM

## 2015-08-29 NOTE — Progress Notes (Signed)
PROGRESS NOTE  Tracy Mccoy ZOX:[id number] DOB: 29-Nov-1922 DOA: 08/27/2015 PCP: No primary care provider on file.  Brief History 82 year old female with a history of dementia presented with1-2 day history of worsening confusion. Approximately 5 days prior to admission, the patient had a mechanical fall going to the bathroom resulting in a T12 compression fracture. She was Prescribed a back brace and hydrocodone.n addition, the patient was started on donepezil approximately 2 weeks prior to admission by Dr. Karilyn Mccoy. She was also started on zolpidem because she was not sleeping well after her fall. Workup in the emergency department revealed right lower lobe infiltrate with small right effusion. Urinalysis was negative for pyuria. CT of the brain showed a nonspecific right posterior frontal convexity abnormality versus artifact. In addition, the family stated that they noted some Right facial droop on the morning of 08/27/2015.  Assessment/Plan: Acute encephalopathy -Multifactorial secondary to infectious process and medications (Norco and zolpidem) -MRI brain--neg for stroke -Continue aspirin -Continue antibiotics -Judicious opioids for pain -B12, TSH--unremarkable -son Tracy Mccoy states that pt is normally pleasantly confused but not combative, feels she is improving CAP -Continue ceftriaxone and azithromycin -Presently stable on room air -afebrile and hemodynamically stable -monitor for signs of aspiration Dysarthria -MRI brain--neg -Speech therapy evaluation -continue aspirin T12 compression fracture -Judicious opioids -PT/OT--pending -may need SNF in setting of underlying dementia Abnormal EKG -Minimal ST elevations II, III, avF -no chest pain -cycle troponins--neg x 3 Dementia -continue Aricept Hypokalemia -Repleted -Check magnesium--1.8   Family Communication: Son Tracy Mccoy updated on phone 08/29/15--he states family somewhat overwhelmed; requests  placement Disposition Plan: SNF 08/30/15 if possible    Procedures/Studies: Ct Head Wo Contrast  08/27/2015  CLINICAL DATA:  Dementia. Patient fell on the fourteenth at home and since then has had decreased appetite. Altered mental status. EXAM: CT HEAD WITHOUT CONTRAST TECHNIQUE: Contiguous axial images were obtained from the base of the skull through the vertex without intravenous contrast. COMPARISON:  None. FINDINGS: Diffuse cerebral atrophy. Mild ventricular dilatation consistent with central atrophy. Low-attenuation changes in the deep white matter consistent with small vessel ischemia. Somewhat hazy appearance of the right posterior frontal sulci at the convexity. This may be due to motion artifact but early changes of acute infarct are not excluded. No mass effect or midline shift. No abnormal extra-axial fluid collections. Gray-white matter junctions are distinct. Basal cisterns are not effaced. No evidence of acute intracranial hemorrhage. No depressed skull fractures. Visualized paranasal sinuses and mastoid air cells are not opacified. Vascular calcifications. IMPRESSION: Chronic atrophy and small vessel ischemic changes. Nonspecific changes in the right posterior frontal convexity may represent artifact but early changes of acute infarct are not excluded. No acute intracranial hemorrhage. No significant mass effect. Electronically Signed   By: Burman Nieves M.D.   On: 08/27/2015 21:28   Mr Brain Wo Contrast  08/28/2015  CLINICAL DATA:  Stroke EXAM: MRI HEAD WITHOUT CONTRAST TECHNIQUE: Multiplanar, multiecho pulse sequences of the brain and surrounding structures were obtained without intravenous contrast. COMPARISON:  CT 08/27/2015 FINDINGS: Incomplete study. The patient was not able to complete the study. Sagittal T1 and axial diffusion imaging performed. Mild motion degrades image quality. Negative for acute infarct.  Generalized atrophy. IMPRESSION: Limited study.  No acute infarct  identified Electronically Signed   By: Marlan Palau M.D.   On: 08/28/2015 10:24   Dg Chest Port 1 View  08/27/2015  CLINICAL DATA:  Patient fell February 14th at home with decreased appetite  since then, current history of thoracic compression deformities EXAM: PORTABLE CHEST 1 VIEW COMPARISON:  08/23/2015 FINDINGS: Moderate cardiac enlargement with significant uncoiling of the aorta. Opacity right lower lobe suggesting consolidation and small effusion. Vascular pattern within normal limits. No pleural effusion on the left. IMPRESSION: Right lower lobe airspace disease with small effusion. Pneumonia or pneumonitis suspected. Electronically Signed   By: Esperanza Heir M.D.   On: 08/27/2015 21:04         Subjective: Pt is Tracy Mccoy confused. She denies any headache, fevers, chills, chest pain, shortness breath, abdominal pain, dysuria.  Objective: Filed Vitals:   08/27/15 2336 08/28/15 0622 08/28/15 1349 08/29/15 0500  BP:  171/64 173/79 165/79  Pulse: 76 72 80 84  Temp:  97.6 F (36.4 C) 97.8 F (36.6 C) 97.7 F (36.5 C)  TempSrc: Axillary Axillary Oral Oral  Resp: Weight:      SpO2:  96% 96% 99%    Intake/Output Summary (Last 24 hours) at 08/29/15 0909 Last data filed at 08/28/15 1700  Gross per 24 hour  Intake    235 ml  Output    600 ml  Net   -365 ml   Weight change:  Exam:   General:  Pt is alert, follows commands appropriately, not in acute distress  HEENT: No icterus, No thrush, No neck mass, [place]/AT  Cardiovascular: RRR, S1/S2, no rubs, no gallops  Respiratory: bibasilar crackles. Right greater than left. No wheezing  Abdomen: Soft/+BS, non tender, non distended, no guarding  Extremities: No edema, No lymphangitis, No petechiae, No rashes, no synovitis  Data Reviewed: Basic Metabolic Panel:  Recent Labs Lab 08/27/15 2038 08/28/15 0632 08/29/15 0602  NA 145 142 140  K 3.7 3.3* 3.8  CL 105 107 106  CO2 GLUCOSE 79 94 101*  BUN  CREATININE 0.84 0.61 0.62  CALCIUM 9.5 8.5* 8.9  MG  --   --  1.8   Liver Function Tests:  Recent Labs Lab 08/27/15 2038  AST 28  ALT 24  ALKPHOS 61  BILITOT 0.3  PROT 7.5  ALBUMIN 3.4*   No results for input(s): LIPASE, AMYLASE in the last 168 hours. No results for input(s): AMMONIA in the last 168 hours. CBC:  Recent Labs Lab 08/27/15 2038 08/28/15 0632  WBC 5.6 5.8  NEUTROABS 2.4 3.1  HGB 12.8 12.3  HCT 40.2 37.5  MCV 93.7 91.5  PLT 257 261   Cardiac Enzymes:  Recent Labs Lab 08/28/15 1024 08/28/15 1608  TROPONINI <0.03 <0.03   BNP: Invalid input(s): POCBNP CBG: No results for input(s): GLUCAP in the last 168 hours.  No results found for this or any previous visit (from the past 240 hour(s)).   Scheduled Meds: . aspirin EC  81 mg Oral Daily  . azithromycin  500 mg Intravenous Q24H  . cefTRIAXone (ROCEPHIN)  IV  2 g Intravenous Q24H  . donepezil  5 mg Oral QHS  . enoxaparin (LOVENOX) injection  40 mg Subcutaneous QHS   Continuous Infusions:    Logan Vegh, DO  [facility] Pager [phone]  If 7PM-7AM, please contact night-coverage [email or web address] Password [email or web address] 08/29/2015, 9:09 AM   LOS: 2 days

## 2015-08-29 NOTE — Evaluation (Signed)
Speech Language Pathology Evaluation Patient Details Name: Tracy Mccoy MRN: [id number] DOB: [birth year] Today's Date: 08/29/2015 Time: 4098-1191 SLP Time Calculation (min) (ACUTE ONLY): 24 min  Problem List:  Patient Active Problem List   Diagnosis Date Noted  . Dementia 08/28/2015  . Compression fracture of T12 vertebra (HCC) 08/28/2015  . Fall 08/28/2015  . Dysarthria 08/28/2015  . CAP (community acquired pneumonia) 08/27/2015  . Acute encephalopathy 08/27/2015   Past Medical History:  Past Medical History  Diagnosis Date  . Dementia    Past Surgical History: History reviewed. No pertinent past surgical history. HPI:  82 yo female adm to [facility] with worsening confusion x 1-2 days, suffered fall x 5 days ago.  Right facial droop noted but CT head and MRI negative.  CXR showed possible right lobe pneumonitis.  Pt for speech/swallow evaluation.     Assessment / Plan / Recommendation Clinical Impression  Pt presents with severe cognitive linguistic deficits - oriented to self only.  She is able to state common social statements such as "bless you".   Verbal output riddled with phonemic/semantic paraphasia but she does have islands of fluent speech noted = approxmateily 20% of output.  Eg: "Thank God", "That's what I like to hear".   Pt is able to folow one step basic commands with visual and at times tactile cues.  Uncertain to baseline status but if pt has acute changes, she may benefit from follow up SlP at next venue of care to maximize functional communication.        SLP Assessment  All further Speech Lanaguage Pathology  needs can be addressed in the next venue of care    Follow Up Recommendations  Skilled Nursing facility    Frequency and Duration           SLP Evaluation Prior Functioning  Cognitive/Linguistic Baseline: Information not available (pt has dementia)   Cognition  Arousal/Alertness: Awake/alert Orientation Level: Oriented to person;Disoriented to  place;Disoriented to time;Disoriented to situation Attention: Sustained Sustained Attention: Impaired Memory: Impaired Awareness: Impaired Awareness Impairment: Intellectual impairment Problem Solving: Impaired Problem Solving Impairment: Verbal basic;Functional basic Behaviors: Confabulation Safety/Judgment: Impaired    Comprehension  Auditory Comprehension Overall Auditory Comprehension: Impaired Yes/No Questions: Impaired Basic Biographical Questions: 0-25% accurate Commands: Impaired One Step Basic Commands: 0-24% accurate Conversation: Simple Visual Recognition/Discrimination Discrimination: Exceptions to [facility] Common Objects: Unable to indentify Reading Comprehension Reading Status: Not tested    Expression Expression Primary Mode of Expression: Verbal Verbal Expression Overall Verbal Expression: Impaired Initiation: Impaired Level of Generative/Spontaneous Verbalization: Conversation Repetition: Impaired Level of Impairment: Word level Naming: Impairment Responsive: 0-25% accurate Confrontation: Impaired Common Objects: Unable to indentify Convergent: 0-24% accurate Divergent: 0-24% accurate Verbal Errors: Semantic paraphasias Pragmatics: No impairment Written Expression Dominant Hand: Right Written Expression: Not tested   Oral / Motor  Oral Motor/Sensory Function Overall Oral Motor/Sensory Function: Mild impairment Facial ROM: Reduced right Facial Symmetry: Abnormal symmetry right Facial Strength: Reduced right Motor Speech Overall Motor Speech: Impaired Respiration: Within functional limits Phonation: Normal Resonance: Within functional limits Articulation: Within functional limitis Intelligibility: Intelligible Motor Planning: Witnin functional limits   GO                    Donavan Burnet, MS [facility] SLP [phone]

## 2015-08-29 NOTE — Evaluation (Signed)
Clinical/Bedside Swallow Evaluation Patient Details  Name: Tracy Mccoy MRN: [id number] Date of Birth: [birth year]  Today's Date: 08/29/2015 Time: SLP Start Time (ACUTE ONLY): 0941 SLP Stop Time (ACUTE ONLY): 0959 SLP Time Calculation (min) (ACUTE ONLY): 18 min  Past Medical History:  Past Medical History  Diagnosis Date  . Dementia    Past Surgical History: History reviewed. No pertinent past surgical history. HPI:  82 yo female adm to [facility] with worsening confusion x 1-2 days, suffered fall x 5 days ago.  Right facial droop noted but CT head and MRI negative.  CXR showed possible right lobe pneumonitis.  Pt for speech/swallow evaluation.     Assessment / Plan / Recommendation Clinical Impression  Pt presents with functional oropharyngeal swallow ability - minimal oral holding noted but no indications of airway compromise.  Pt is able to self feed and does not have oral residuals despite decreased right facial movement.    Recommend regular/thin diet with intermittent supervision to encourage intake as SLP with pt 45 minutes total and she only consumed 1 cracker, 4 ounces of applesauce, Ensure, Cola and water.  She benefited from verbal cues to continue to consume po.    No follow up indicated as functional swallow present.      Aspiration Risk  Mild aspiration risk    Diet Recommendation Regular;Thin liquid   Medication Administration: Other (Comment) (as tolerated) Supervision: Patient able to self feed Compensations: Slow rate;Small sips/bites    Other  Recommendations Oral Care Recommendations: Oral care BID   Follow up Recommendations  None    Frequency and Duration   n/a         Prognosis   n/a     Swallow Study   General Date of Onset: 08/29/15 HPI: 82 yo female adm to [facility] with worsening confusion x 1-2 days, suffered fall x 5 days ago.  Right facial droop noted but CT head and MRI negative.  CXR showed possible right lobe pneumonitis.  Pt for speech/swallow  evaluation.   Type of Study: Bedside Swallow Evaluation Diet Prior to this Study: NPO Temperature Spikes Noted: No Respiratory Status: Room air History of Recent Intubation: No Behavior/Cognition: Alert;Cooperative;Confused Oral Care Completed by SLP: No Oral Cavity - Dentition: Adequate natural dentition Vision: Functional for self-feeding Self-Feeding Abilities: Able to feed self Patient Positioning: Upright in bed Baseline Vocal Quality: Normal Volitional Cough: Strong    Oral/Motor/Sensory Function Overall Oral Motor/Sensory Function: Mild impairment Facial ROM: Reduced right Facial Symmetry: Abnormal symmetry right Facial Strength: Reduced right   Ice Chips Ice chips: Not tested   Thin Liquid Thin Liquid: Within functional limits    Nectar Thick Nectar Thick Liquid: Not tested   Honey Thick Honey Thick Liquid: Not tested   Puree Puree: Within functional limits Presentation: Self Fed;Spoon   Solid   GO   Solid: Within functional limits Presentation: Self Fed;Spoon        Donavan Burnet, MS [facility] SLP [phone]

## 2015-08-29 NOTE — Evaluation (Signed)
Physical Therapy Evaluation Patient Details Name: Tracy Mccoy MRN: [id number] DOB: [birth year] Today's Date: 08/29/2015   History of Present Illness  82 yo female admitted with acute encephalopathy. Hx of dementia, fall, T12 comp fracture 08/23/15.   Clinical Impression  On eval, pt required Min-Mod assist +2 for mobility. Pt was able to stand from recliner and walk ~7 feet with RW. Son present and assisted with encouragement for participation. Pt tolerated activity well. Recommend ST rehab at SNF.    Follow Up Recommendations SNF    Equipment Recommendations  None recommended by PT    Recommendations for Other Services       Precautions / Restrictions Precautions Precautions: Fall;Back Required Braces or Orthoses: Spinal Brace (CASH brace) Spinal Brace: Applied in sitting position Restrictions Weight Bearing Restrictions: No      Mobility  Bed Mobility Overal bed mobility: Needs Assistance Bed Mobility: Supine to Sit     Supine to sit: Mod assist     General bed mobility comments: oob in recliner  Transfers Overall transfer level: Needs assistance Equipment used: Rolling walker (2 wheeled) Transfers: Sit to/from Stand Sit to Stand: Mod assist;+2 physical assistance;+2 safety/equipment Stand pivot transfers: +2 safety/equipment;Mod assist;Max assist       General transfer comment: Assist to rise, stabilize, control descent. Mod encouragement from son to get pt to participate.   Ambulation/Gait Ambulation/Gait assistance: Min assist;+2 physical assistance;+2 safety/equipment Ambulation Distance (Feet): 7 Feet Assistive device: Rolling walker (2 wheeled) Gait Pattern/deviations: Step-through pattern;Decreased stride length     General Gait Details: Assist to stabilize pt and maneuver with walker. Pt tolerated short distance well. Followed with recliner for safety.   Stairs            Wheelchair Mobility    Modified Rankin (Stroke Patients Only)        Balance Overall balance assessment: Needs assistance         Standing balance support: Bilateral upper extremity supported;During functional activity Standing balance-Leahy Scale: Poor                               Pertinent Vitals/Pain Pain Assessment: Faces Faces Pain Scale: Hurts a little bit Pain Location: back Pain Descriptors / Indicators: Sore Pain Intervention(s): Limited activity within patient's tolerance;Monitored during session    Home Living Family/patient expects to be discharged to:: Skilled nursing facility Living Arrangements: Alone                    Prior Function           Comments: using walker PTA     Hand Dominance        Extremity/Trunk Assessment   Upper Extremity Assessment: Defer to OT evaluation           Lower Extremity Assessment: Generalized weakness      Cervical / Trunk Assessment: Kyphotic  Communication   Communication: HOH  Cognition Arousal/Alertness: Awake/alert Behavior During Therapy: Impulsive;Restless Overall Cognitive Status: Impaired/Different from baseline Area of Impairment: Orientation;Attention;Memory;Following commands;Safety/judgement;Problem solving Orientation Level: Disoriented to;Place;Time;Situation Current Attention Level: Focused Memory: Decreased recall of precautions Following Commands: Follows one step commands with increased time     Problem Solving: Requires tactile cues;Requires verbal cues;Decreased initiation General Comments: pt also HOH    General Comments      Exercises        Assessment/Plan    PT Assessment Patient needs continued PT services  PT Diagnosis Difficulty walking;Generalized  weakness;Altered mental status   PT Problem List Decreased strength;Decreased activity tolerance;Decreased balance;Decreased mobility;Decreased knowledge of use of DME;Decreased cognition;Pain  PT Treatment Interventions DME instruction;Gait training;Therapeutic  activities;Functional mobility training;Patient/family education;Therapeutic exercise;Balance training   PT Goals (Current goals can be found in the Care Plan section) Acute Rehab PT Goals Patient Stated Goal: son states they want rehab for pt  PT Goal Formulation: With family Time For Goal Achievement: 09/12/15 Potential to Achieve Goals: Good    Frequency Min 3X/week   Barriers to discharge        Co-evaluation               End of Session Equipment Utilized During Treatment: Back brace Activity Tolerance: Patient tolerated treatment well Patient left: in chair;with call bell/phone within reach;with chair alarm set;with family/visitor present           Time: 1353-1405 PT Time Calculation (min) (ACUTE ONLY): 12 min   Charges:   PT Evaluation $PT Eval Moderate Complexity: 1 Procedure     PT G Codes:        Rebeca Alert, MPT Pager: [phone]

## 2015-08-30 ENCOUNTER — Ambulatory Visit: Payer: Medicare Other | Admitting: Orthopaedic Surgery

## 2015-08-30 DIAGNOSIS — M4854XS Collapsed vertebra, not elsewhere classified, thoracic region, sequela of fracture: Secondary | ICD-10-CM

## 2015-08-30 MED ORDER — HYDROCODONE-ACETAMINOPHEN 5-325 MG PO TABS: 1.0000 | ORAL_TABLET | Freq: Four times a day (QID) | ORAL | Status: DC | PRN

## 2015-08-30 MED ORDER — AZITHROMYCIN 500 MG PO TABS: 500.0000 mg | ORAL_TABLET | ORAL | Status: DC

## 2015-08-30 MED ORDER — ALPRAZOLAM 0.5 MG PO TABS: 0.5000 mg | ORAL_TABLET | Freq: Two times a day (BID) | ORAL | Status: DC | PRN

## 2015-08-30 MED ORDER — AZITHROMYCIN 500 MG PO TABS
500.0000 mg | ORAL_TABLET | ORAL | Status: DC
  Administered 2015-08-30 – 2015-08-31 (×2): 500 mg via ORAL
  Filled 2015-08-30 (×3): qty 1

## 2015-08-30 MED ORDER — ALPRAZOLAM 0.25 MG PO TABS
0.2500 mg | ORAL_TABLET | Freq: Once | ORAL | Status: AC
  Administered 2015-08-30: 0.25 mg via ORAL
  Filled 2015-08-30: qty 1

## 2015-08-30 MED ORDER — CEFDINIR 300 MG PO CAPS: 300.0000 mg | ORAL_CAPSULE | Freq: Two times a day (BID) | ORAL | Status: DC

## 2015-08-30 NOTE — Discharge Summary (Signed)
Physician Discharge Summary  Tracy Mccoy WUJ:[id number] DOB: 1922-09-27 DOA: 08/27/2015  PCP: No primary care provider on file.  Admit date: 08/27/2015 Discharge date: 08/30/2015  Recommendations for Outpatient Follow-up:  1. Pt will need to follow up with PCP in 2 weeks post discharge 2. Please obtain BMP and CBC in one week  Discharge Diagnoses:  Acute encephalopathy -Multifactorial secondary to infectious process and medications (Norco and zolpidem) -MRI brain--neg for stroke -Continue aspirin -Continue antibiotics -Judicious opioids for pain -B12, TSH--unremarkable -son Tracy Mccoy states that pt is normally pleasantly confused but not combative, feels she is improving -xanax dose decreased to 0.5 mg bid prn without problems during hospitalization CAP -Continue ceftriaxone and azithromycin -Presently stable on room air -afebrile and hemodynamically stable -monitor for signs of aspiration -discharge with omnicef and azithromycin x 4 more days to complete 7 days of therapy Dysarthria -MRI brain--neg -Speech therapy evaluation-->regular diet -continue aspirin T12 compression fracture -Judicious opioids -PT/OT--pending -may need SNF in setting of underlying dementia Abnormal EKG -Minimal ST elevations II, III, avF -no chest pain -cycle troponins--neg x 3 Dementia -continue Aricept -resume zyprexa at hs  Hypokalemia -Repleted -Check magnesium--1.8  Discharge Condition: stable  Disposition: SNF  Diet:regular Wt Readings from Last 3 Encounters:  08/27/15 64.411 kg (142 lb)  08/23/15 64.774 kg (142 lb 12.8 oz)    History of present illness:  82 year old female with a history of dementia presented with1-2 day history of worsening confusion. Approximately 5 days prior to admission, the patient had a mechanical fall going to the bathroom resulting in a T12 compression fracture. She was Prescribed a back brace and hydrocodone.n addition, the patient was started on donepezil  approximately 2 weeks prior to admission by Dr. Karilyn Mccoy. She was also started on zolpidem because she was not sleeping well after her fall. Workup in the emergency department revealed right lower lobe infiltrate with small right effusion. Urinalysis was negative for pyuria. CT of the brain showed a nonspecific right posterior frontal convexity abnormality versus artifact. In addition, the family stated that they noted some Right facial droop on the morning of 08/27/2015.  MRI of the brain was negative. The patient's hypnotic medications were decreased during the hospitalization. Her mental status improved. She remained pleasantly confused which is her baseline according to the patient's son. She will be discharged with 4 more days of oral antibiotics to complete 7 days of therapy.  Discharge Exam: Filed Vitals:   08/29/15 2219 08/30/15 0440  BP: 170/73 156/94  Pulse: 85 73  Temp: 98.2 F (36.8 C) 97.9 F (36.6 C)  Resp: 18 18   Filed Vitals:   08/29/15 0500 08/29/15 1333 08/29/15 2219 08/30/15 0440  BP: 165/79 128/58 170/73 156/94  Pulse: 84 85 85 73  Temp: 97.7 F (36.5 C) 97.8 F (36.6 C) 98.2 F (36.8 C) 97.9 F (36.6 C)  TempSrc: Oral Oral Oral Oral  Resp: Height:      Weight:      SpO2: 99% 97% 98% 95%   General: Awake and alert, NAD, pleasant, cooperative Cardiovascular: RRR, no rub, no gallop, no S3 Respiratory:  Abdomen:soft, nontender, nondistended, positive bowel sounds Extremities: No edema, No lymphangitis, no petechiae  Discharge Instructions      Discharge Instructions    Diet - low sodium heart healthy    Complete by:  As directed      Increase activity slowly    Complete by:  As directed  Medication List    STOP taking these medications        zolpidem 5 MG tablet  Commonly known as:  AMBIEN      TAKE these medications        ALPRAZolam 0.5 MG tablet  Commonly known as:  XANAX  Take 1 tablet (0.5 mg total) by mouth 2  (two) times daily as needed.     aspirin EC 81 MG tablet  Take 81 mg by mouth daily.     azithromycin 500 MG tablet  Commonly known as:  ZITHROMAX  Take 1 tablet (500 mg total) by mouth daily.     cefdinir 300 MG capsule  Commonly known as:  OMNICEF  Take 1 capsule (300 mg total) by mouth 2 (two) times daily.     donepezil 5 MG tablet  Commonly known as:  ARICEPT  Take 5 mg by mouth at bedtime.     HYDROcodone-acetaminophen 5-325 MG tablet  Commonly known as:  NORCO/VICODIN  Take 1 tablet by mouth every 6 (six) hours as needed for moderate pain.     multivitamin with minerals Tabs tablet  Take 1 tablet by mouth daily.     OLANZapine 2.5 MG tablet  Commonly known as:  ZYPREXA  Take 2.5 mg by mouth at bedtime.         The results of significant diagnostics from this hospitalization (including imaging, microbiology, ancillary and laboratory) are listed below for reference.    Significant Diagnostic Studies: Ct Head Wo Contrast  08/27/2015  CLINICAL DATA:  Dementia. Patient fell on the fourteenth at home and since then has had decreased appetite. Altered mental status. EXAM: CT HEAD WITHOUT CONTRAST TECHNIQUE: Contiguous axial images were obtained from the base of the skull through the vertex without intravenous contrast. COMPARISON:  None. FINDINGS: Diffuse cerebral atrophy. Mild ventricular dilatation consistent with central atrophy. Low-attenuation changes in the deep white matter consistent with small vessel ischemia. Somewhat hazy appearance of the right posterior frontal sulci at the convexity. This may be due to motion artifact but early changes of acute infarct are not excluded. No mass effect or midline shift. No abnormal extra-axial fluid collections. Gray-white matter junctions are distinct. Basal cisterns are not effaced. No evidence of acute intracranial hemorrhage. No depressed skull fractures. Visualized paranasal sinuses and mastoid air cells are not opacified.  Vascular calcifications. IMPRESSION: Chronic atrophy and small vessel ischemic changes. Nonspecific changes in the right posterior frontal convexity may represent artifact but early changes of acute infarct are not excluded. No acute intracranial hemorrhage. No significant mass effect. Electronically Signed   By: Burman Nieves M.D.   On: 08/27/2015 21:28   Mr Brain Wo Contrast  08/28/2015  CLINICAL DATA:  Stroke EXAM: MRI HEAD WITHOUT CONTRAST TECHNIQUE: Multiplanar, multiecho pulse sequences of the brain and surrounding structures were obtained without intravenous contrast. COMPARISON:  CT 08/27/2015 FINDINGS: Incomplete study. The patient was not able to complete the study. Sagittal T1 and axial diffusion imaging performed. Mild motion degrades image quality. Negative for acute infarct.  Generalized atrophy. IMPRESSION: Limited study.  No acute infarct identified Electronically Signed   By: Marlan Palau M.D.   On: 08/28/2015 10:24   Dg Chest Port 1 View  08/27/2015  CLINICAL DATA:  Patient fell February 14th at home with decreased appetite since then, current history of thoracic compression deformities EXAM: PORTABLE CHEST 1 VIEW COMPARISON:  08/23/2015 FINDINGS: Moderate cardiac enlargement with significant uncoiling of the aorta. Opacity right lower lobe suggesting consolidation  and small effusion. Vascular pattern within normal limits. No pleural effusion on the left. IMPRESSION: Right lower lobe airspace disease with small effusion. Pneumonia or pneumonitis suspected. Electronically Signed   By: Esperanza Heir M.D.   On: 08/27/2015 21:04     Microbiology: Recent Results (from the past 240 hour(s))  Culture, blood (routine x 2)     Status: None (Preliminary result)   Collection Time: 08/27/15  9:30 PM  Result Value Ref Range Status   Specimen Description BLOOD RIGHT ANTECUBITAL  Final   Special Requests BOTTLES DRAWN AEROBIC AND ANAEROBIC 5CC  Final   Culture   Final    NO GROWTH 1  DAY Performed at [facility]    Report Status PENDING  Incomplete  Culture, blood (routine x 2)     Status: None (Preliminary result)   Collection Time: 08/27/15  9:35 PM  Result Value Ref Range Status   Specimen Description BLOOD BLOOD LEFT FOREARM  Final   Special Requests BOTTLES DRAWN AEROBIC ONLY 7CC  Final   Culture   Final    NO GROWTH 1 DAY Performed at [facility]    Report Status PENDING  Incomplete     Labs: Basic Metabolic Panel:  Recent Labs Lab 08/27/15 2038 08/28/15 0632 08/29/15 0602  NA 145 142 140  K 3.7 3.3* 3.8  CL 105 107 106  CO2 27 25 23   GLUCOSE 79 94 101*  BUN 20 15 11   CREATININE 0.84 0.61 0.62  CALCIUM 9.5 8.5* 8.9  MG  --   --  1.8   Liver Function Tests:  Recent Labs Lab 08/27/15 2038  AST 28  ALT 24  ALKPHOS 61  BILITOT 0.3  PROT 7.5  ALBUMIN 3.4*   No results for input(s): LIPASE, AMYLASE in the last 168 hours. No results for input(s): AMMONIA in the last 168 hours. CBC:  Recent Labs Lab 08/27/15 2038 08/28/15 0632  WBC 5.6 5.8  NEUTROABS 2.4 3.1  HGB 12.8 12.3  HCT 40.2 37.5  MCV 93.7 91.5  PLT 257 261   Cardiac Enzymes:  Recent Labs Lab 08/28/15 1024 08/28/15 1608  TROPONINI <0.03 <0.03   BNP: Invalid input(s): POCBNP CBG: No results for input(s): GLUCAP in the last 168 hours.  Time coordinating discharge:  Greater than 30 minutes  Signed:  Keaundra Stehle, DO [facility] Pager: [phone] 08/30/2015, 9:07 AM

## 2015-08-30 NOTE — Care Management Important Message (Signed)
Important Message  Patient Details  Name: Leda Bellefeuille MRN: [id number] Date of Birth: 25-Sep-1922   Medicare Important Message Given:  Yes    Haskell Flirt 08/30/2015, 1:19 PMImportant Message  Patient Details  Name: Tammela Bales MRN: [id number] Date of Birth: 1923/02/22   Medicare Important Message Given:  Yes    Haskell Flirt 08/30/2015, 1:15 PM

## 2015-08-30 NOTE — Clinical Social Work Placement (Signed)
   CLINICAL SOCIAL WORK PLACEMENT  NOTE  Date:  08/30/2015  Patient Details  Name: Tracy Mccoy MRN: [id number] Date of Birth: 09-19-1922  Clinical Social Work is seeking post-discharge placement for this patient at the Skilled  Nursing Facility level of care (*CSW will initial, date and re-position this form in  chart as items are completed):  No   Patient/family provided with [facility] Clinical Social Work Department's list of facilities offering this level of care within the geographic area requested by the patient (or if unable, by the patient's family).  No   Patient/family informed of their freedom to choose among providers that offer the needed level of care, that participate in Medicare, Medicaid or managed care program needed by the patient, have an available bed and are willing to accept the patient.  No   Patient/family informed of [facility]'s ownership interest in [facility] and [facility], as well as of the fact that they are under no obligation to receive care at these facilities.  PASRR submitted to EDS on 08/30/15     PASRR number received on 08/30/15     Existing PASRR number confirmed on       FL2 transmitted to all facilities in geographic area requested by pt/family on 08/30/15     FL2 transmitted to all facilities within larger geographic area on       Patient informed that his/her managed care company has contracts with or will negotiate with certain facilities, including the following:        No   Patient/family informed of bed offers received.  Patient chooses bed at       Physician recommends and patient chooses bed at      Patient to be transferred to   on  .  Patient to be transferred to facility by       Patient family notified on   of transfer.  Name of family member notified:        PHYSICIAN       Additional Comment:    _______________________________________________ Leron Croak 08/30/2015, 4:42 PM

## 2015-08-30 NOTE — NC FL2 (Signed)
  [place] MEDICAID FL2 LEVEL OF CARE SCREENING TOOL     IDENTIFICATION  Patient Name: Tracy Mccoy Birthdate: June 10, 1923 Sex: female Admission Date (Current Location): 08/27/2015  [facility] and [place] Number:  Geophysical data processor and Address:  [facility],  [address] [place] [address]      Provider Number: [id number]  Attending Physician Name and Address:  Catarina Hartshorn, MD  Relative Name and Phone Number:       Current Level of Care: Hospital Recommended Level of Care: Skilled Nursing Facility Prior Approval Number:    Date Approved/Denied:   PASRR Number: [id number]  Discharge Plan: SNF    Current Diagnoses: Patient Active Problem List   Diagnosis Date Noted  . Dementia 08/28/2015  . Compression fracture of T12 vertebra (HCC) 08/28/2015  . Fall 08/28/2015  . Dysarthria 08/28/2015  . CAP (community acquired pneumonia) 08/27/2015  . Acute encephalopathy 08/27/2015    Orientation RESPIRATION BLADDER Height & Weight     Self  Normal Incontinent Weight: 142 lb (64.411 kg) Height:   (162.6 cm)  BEHAVIORAL SYMPTOMS/MOOD NEUROLOGICAL BOWEL NUTRITION STATUS   (no behaviors)  (NONE) Continent Diet (Regular;Thin liquid )  AMBULATORY STATUS COMMUNICATION OF NEEDS Skin   Limited Assist (Min assist;+2 physical assistance;+2 safety/equipment) Verbally Normal                       Personal Care Assistance Level of Assistance  Bathing, Feeding, Dressing Bathing Assistance: Maximum assistance Feeding assistance: Independent Dressing Assistance: Maximum assistance     Functional Limitations Info  Sight, Hearing, Speech Sight Info: Adequate Hearing Info: Adequate Speech Info: Adequate    SPECIAL CARE FACTORS FREQUENCY  PT (By licensed PT), OT (By licensed OT)     PT Frequency: 5 x a week OT Frequency: 5 x a week            Contractures Contractures Info: Not present    Additional Factors Info  Code Status, Allergies Code Status  Info: FULL code status Allergies Info: No Known Allergies           Current Medications (08/30/2015):  This is the current hospital active medication list Current Facility-Administered Medications  Medication Dose Route Frequency Provider Last Rate Last Dose  . aspirin EC tablet 81 mg  81 mg Oral Daily Clydie Braun, MD   81 mg at 08/30/15 0951  . azithromycin (ZITHROMAX) tablet 500 mg  500 mg Oral Q24H Catarina Hartshorn, MD      . cefTRIAXone (ROCEPHIN) 2 g in dextrose 5 % 50 mL IVPB  2 g Intravenous Q24H Clydie Braun, MD   2 g at 08/30/15 0951  . donepezil (ARICEPT) tablet 5 mg  5 mg Oral QHS Catarina Hartshorn, MD   5 mg at 08/29/15 2133  . enoxaparin (LOVENOX) injection 40 mg  40 mg Subcutaneous QHS Clydie Braun, MD   40 mg at 08/29/15 2133  . HYDROcodone-acetaminophen (NORCO/VICODIN) 5-325 MG per tablet 1 tablet  1 tablet Oral Q6H PRN Clydie Braun, MD   1 tablet at 08/28/15 2106     Discharge Medications: Please see discharge summary for a list of discharge medications.  Relevant Imaging Results:  Relevant Lab Results:   Additional Information SSN: [id number]  KIDD, SUZANNA A, LCSW

## 2015-08-30 NOTE — Clinical Social Work Note (Signed)
Clinical Social Work Assessment  Patient Details  Name: Tracy Mccoy MRN: [id number] Date of Birth: Nov 30, 1922  Date of referral:  08/30/15               Reason for consult:  Discharge Planning, Facility Placement                Permission sought to share information with:  Facility Art therapist granted to share information::  Yes, Verbal Permission Granted  Name::     Denver Harder   Agency::  [facility]   Relationship::  son  Contact Information:  [phone]  Housing/Transportation Living arrangements for the past 2 months:  Single Family Home Source of Information:  Adult Children Patient Interpreter Needed:  None Criminal Activity/Legal Involvement Pertinent to Current Situation/Hospitalization:  No - Comment as needed Significant Relationships:  Adult Children, Other Family Members Lives with:  Relatives (Pt's niece is staying with the Pt ) Do you feel safe going back to the place where you live?  Yes Need for family participation in patient care:  Yes (Comment) (Pt is not alert and oriented )  Care giving concerns: Pt's son has concerns about the Pt going to a facility because Pt has never needed a placement and the family is unsure how the Pt will do.   Social Worker assessment / plan:  CSW introduced self to [employer] (son:[phone]) for d/c planning and PT recommendations. Pt's son lives in [place] and has traveled to see Pt while she is in the hospital. Pt's niece has been residing with the Pt after Pt's fall to assist Pt. Pt's son was interested in bringing the Pt back home with their assistance. CSW explained the PT recommendations and the reason why they felt Pt would need more assistance. Pt's son was in agreement that Pt could be worked up for SNF facility placement. Pt's son stated he will look at the facilities, CSW provided Pt's family with names and contact numbers of facilities for visits and answering any questions that they may  have. Pt's son will met with CSW in the a.m. For review of offers and assist with facility choice.   Employment status:  Disabled (Comment on whether or not currently receiving Disability) Insurance information:  Public librarian) PT Recommendations:  Home with [place], [place], [place] / Referral to community resources:  [place]  Patient/Family's Response to care:  Pt's family was appreciative and thanked CSW for answering questions as needed.  Patient/Family's Understanding of and Emotional Response to Diagnosis, Current Treatment, and Prognosis:  CSW explained PT recommendation and Pt was understanding of Pt need for more intensive PT.   Emotional Assessment Appearance:  Appears stated age Attitude/Demeanor/Rapport:    Affect (typically observed):  Accepting, Calm, Pleasant Orientation:  Oriented to Self Alcohol / Substance use:  Not Applicable Psych involvement (Current and /or in the community):  No (Comment)  Discharge Needs  Concerns to be addressed:  Discharge Planning Concerns, Decision making concerns Readmission within the last 30 days:  No Current discharge risk:  Dependent with Mobility Barriers to Discharge:  No SNF bed (Starting search)   Pete Pelt 08/30/2015, 4:31 PM

## 2015-08-31 NOTE — Progress Notes (Signed)
Pt seen and examined at bedside, wants to be discharged, please see completed d/c summary by Dr. Tat 08/30/2015. No changes needed.  MAGICK-Caeleigh Prohaska, MD  [facility] Pager [phone]  If 7PM-7AM, please contact night-coverage [email or web address] Password [email or web address]  

## 2015-08-31 NOTE — Progress Notes (Signed)
CSW received a call from [place] and they authorized the pt for 3 days.  Auth #: [id number] Review Date: Sep 03, 2015 [place] is RUB  Leron Croak LCSWA  [facility]  [phone]

## 2015-08-31 NOTE — Progress Notes (Signed)
CSW contacted the [employer] to see if there is an authorization.   Pt does not have a authorization today.   CSW will follow up with [employer] in the morning for authorization and SNF placement.   CSW updated the Pt's family on delay and possible d/c tomorrow.   Leron Croak [facility] [phone]

## 2015-08-31 NOTE — Progress Notes (Signed)
Physical Therapy Treatment Patient Details Name: Tracy Mccoy MRN: [id number] DOB: [birth year] Today's Date: 08/31/2015    History of Present Illness 82 yo female admitted with acute encephalopathy. Hx of dementia, fall, T12 comp fracture 08/23/15.     PT Comments    Progressing with mobility. Pt much more engaging on today. Continues to require repeated multimodal cueing for participation. Son present during session. Continue to recommend SNF.   Follow Up Recommendations  SNF     Equipment Recommendations  None recommended by PT    Recommendations for Other Services       Precautions / Restrictions Precautions Precautions: Fall;Back Precaution Comments: back precautions for safety, pain control Required Braces or Orthoses: Spinal Brace Spinal Brace: Thoracolumbosacral orthotic;Applied in sitting position (CASH brace) Restrictions Weight Bearing Restrictions: No    Mobility  Bed Mobility Overal bed mobility: Needs Assistance Bed Mobility: Supine to Sit     Supine to sit: Mod assist;HOB elevated     General bed mobility comments: assist for trunk and LEs. Multimodal cues to encourage pt to perform as much as task as she could. Increased time.   Transfers Overall transfer level: Needs assistance Equipment used: Rolling walker (2 wheeled) Transfers: Sit to/from Stand Sit to Stand: Min assist;+2 physical assistance;+2 safety/equipment         General transfer comment: Assist to rise, stabilize, control descent. Mod encouragement from son to get pt to participate. Multimodal cues for safety, hand placement  Ambulation/Gait Ambulation/Gait assistance: Min assist;+2 physical assistance;+2 safety/equipment Ambulation Distance (Feet): 12 Feet Assistive device: Rolling walker (2 wheeled) Gait Pattern/deviations: Step-through pattern;Decreased stride length     General Gait Details: Assist to stabilize pt and maneuver with walker. Pt tolerated short distance well. Easily  distracted by objects in environment.    Stairs            Wheelchair Mobility    Modified Rankin (Stroke Patients Only)       Balance           Standing balance support: Bilateral upper extremity supported;During functional activity Standing balance-Leahy Scale: Poor                      Cognition Arousal/Alertness: Awake/alert Behavior During Therapy: Impulsive Overall Cognitive Status: Impaired/Different from baseline Area of Impairment: Orientation;Attention;Memory;Following commands;Safety/judgement;Problem solving Orientation Level: Disoriented to;Place;Situation;Time Current Attention Level: Focused Memory: Decreased recall of precautions;Decreased short-term memory Following Commands: Follows one step commands with increased time     Problem Solving: Requires tactile cues;Requires verbal cues;Decreased initiation General Comments: pt also HOH    Exercises      General Comments        Pertinent Vitals/Pain Pain Assessment: Faces Faces Pain Scale: Hurts a little bit Pain Location: back  Pain Descriptors / Indicators: Sore Pain Intervention(s): Limited activity within patient's tolerance;Monitored during session    Home Living                      Prior Function            PT Goals (current goals can now be found in the care plan section) Progress towards PT goals: Progressing toward goals    Frequency  Min 3X/week    PT Plan Current plan remains appropriate    Co-evaluation             End of Session Equipment Utilized During Treatment: Back brace Activity Tolerance: Patient tolerated treatment well Patient left: in chair;with call bell/phone within  reach;with chair alarm set;with family/visitor present     Time: 8295-6213 PT Time Calculation (min) (ACUTE ONLY): 14 min  Charges:  $Gait Training: 8-22 mins                    G Codes:      Rebeca Alert, MPT Pager: [phone]

## 2015-09-01 ENCOUNTER — Inpatient Hospital Stay (HOSPITAL_COMMUNITY): Payer: Medicare Other | Attending: Internal Medicine

## 2015-09-01 ENCOUNTER — Inpatient Hospital Stay
Admission: RE | Admit: 2015-09-01 | Discharge: 2015-09-16 | Disposition: A | Discharge status: Home / Self Care | Payer: Medicare Other | Source: Ambulatory Visit | Attending: Internal Medicine | Admitting: Internal Medicine

## 2015-09-01 ENCOUNTER — Ambulatory Visit (HOSPITAL_COMMUNITY): Payer: Medicare Other

## 2015-09-01 DIAGNOSIS — W19XXXA Unspecified fall, initial encounter: Principal | ICD-10-CM

## 2015-09-01 DIAGNOSIS — R52 Pain, unspecified: Secondary | ICD-10-CM

## 2015-09-01 NOTE — Progress Notes (Signed)
Pt for discharge to [facility].   CSW received notification that [facility] authorization received.  CSW facilitated pt discharge needs including contacting facility, faxing pt discharge information via epic hub, discussing with pt at bedside and pt son, [name] via telephone. Pt son, [name] is on the way to hospital to be with pt during transition to [facility]. CSW provided RN phone number to call report and arranged ambulance transport via PTAR.   No further social work needs identified at this time.  CSW signing off.   Loletta Specter, MSW, LCSW Clinical Social Work 5E Coverage  [phone]

## 2015-09-01 NOTE — Progress Notes (Signed)
Report called to Tracy Mccoy at [facility]. 

## 2015-09-01 NOTE — Progress Notes (Signed)
Pt seen and examined at bedside, wants to be discharged, please see completed d/c summary by Dr. Arbutus Leas 08/30/2015. No changes needed.  Debbora Presto, MD  [facility] Pager [phone]  If 7PM-7AM, please contact night-coverage [email or web address] Password [email or web address]

## 2015-09-01 NOTE — Clinical Social Work Placement (Signed)
   CLINICAL SOCIAL WORK PLACEMENT  NOTE  Date:  09/01/2015  Patient Details  Name: Tracy Mccoy MRN: [id number] Date of Birth: 1923/01/15  Clinical Social Work is seeking post-discharge placement for this patient at the Skilled  Nursing Facility level of care (*CSW will initial, date and re-position this form in  chart as items are completed):  No   Patient/family provided with [place] Clinical Social Work Department's list of facilities offering this level of care within the geographic area requested by the patient (or if unable, by the patient's family).  No   Patient/family informed of their freedom to choose among providers that offer the needed level of care, that participate in Medicare, Medicaid or managed care program needed by the patient, have an available bed and are willing to accept the patient.  No   Patient/family informed of [facility]'s ownership interest in [facility] and [facility], as well as of the fact that they are under no obligation to receive care at these facilities.  PASRR submitted to EDS on 08/30/15     PASRR number received on 08/30/15     Existing PASRR number confirmed on       FL2 transmitted to all facilities in geographic area requested by pt/family on 08/30/15     FL2 transmitted to all facilities within larger geographic area on       Patient informed that his/her managed care company has contracts with or will negotiate with certain facilities, including the following:        Yes   Patient/family informed of bed offers received.  Patient chooses bed at [facility]     Physician recommends and patient chooses bed at      Patient to be transferred to [facility] on 09/01/15.  Patient to be transferred to facility by ambulance Sharin Mons)     Patient family notified on 09/01/15 of transfer.  Name of family member notified:  pt notified at bedside and pt son, [name] notified via telephone     PHYSICIAN        Additional Comment:    _______________________________________________ Orson Eva, LCSW 09/01/2015, 9:51 AM

## 2015-09-02 ENCOUNTER — Encounter (HOSPITAL_COMMUNITY)
Admission: RE | Admit: 2015-09-02 | Discharge: 2015-09-02 | Disposition: A | Discharge status: Home / Self Care | Payer: Medicare Other | Source: Skilled Nursing Facility | Attending: Internal Medicine | Admitting: Internal Medicine

## 2015-09-02 ENCOUNTER — Non-Acute Institutional Stay (SKILLED_NURSING_FACILITY): Payer: Medicare Other | Admitting: Internal Medicine

## 2015-09-02 DIAGNOSIS — M4854XD Collapsed vertebra, not elsewhere classified, thoracic region, subsequent encounter for fracture with routine healing: Secondary | ICD-10-CM | POA: Diagnosis not present

## 2015-09-02 DIAGNOSIS — W19XXXD Unspecified fall, subsequent encounter: Secondary | ICD-10-CM

## 2015-09-02 DIAGNOSIS — F0391 Unspecified dementia with behavioral disturbance: Secondary | ICD-10-CM

## 2015-09-02 DIAGNOSIS — J189 Pneumonia, unspecified organism: Secondary | ICD-10-CM | POA: Diagnosis not present

## 2015-09-02 DIAGNOSIS — S22080D Wedge compression fracture of T11-T12 vertebra, subsequent encounter for fracture with routine healing: Secondary | ICD-10-CM

## 2015-09-02 LAB — CBC
HCT: 39 % (ref 36.0–46.0)
HEMOGLOBIN: 12.7 g/dL (ref 12.0–15.0)
MCH: 30.3 pg (ref 26.0–34.0)
MCHC: 32.6 g/dL (ref 30.0–36.0)
MCV: 93.1 fL (ref 78.0–100.0)
Platelets: 238 10*3/uL (ref 150–400)
RBC: 4.19 MIL/uL (ref 3.87–5.11)
RDW: 13.3 % (ref 11.5–15.5)
WBC: 7.4 10*3/uL (ref 4.0–10.5)

## 2015-09-02 LAB — BASIC METABOLIC PANEL
ANION GAP: 9 (ref 5–15)
BUN: 29 mg/dL — AB (ref 6–20)
CHLORIDE: 107 mmol/L (ref 101–111)
CO2: 27 mmol/L (ref 22–32)
Calcium: 8.7 mg/dL — ABNORMAL LOW (ref 8.9–10.3)
Creatinine, Ser: 0.76 mg/dL (ref 0.44–1.00)
GFR calc Af Amer: >60 mL/min (ref >60)
GFR calc non Af Amer: >60 mL/min (ref >60)
GLUCOSE: 114 mg/dL — AB (ref 65–99)
POTASSIUM: 3.6 mmol/L (ref 3.5–5.1)
SODIUM: 143 mmol/L (ref 135–145)

## 2015-09-02 LAB — CULTURE, BLOOD (ROUTINE X 2)
CULTURE: NO GROWTH
CULTURE: NO GROWTH

## 2015-09-02 NOTE — Progress Notes (Signed)
Patient ID: Tracy Mccoy, female   DOB: 1923-07-08, 82 y.o.   MRN: [id number]   This is an acute visit.  Level care skilled.  Facility [facility].  Chief complaint acute visit status post hospitalization for encephalopathy thought to be multifactorial-pneumonia.  History of present illness.  Patient is a 82 year old female with a history of dementia presented with worsening confusion and a mechanical fall 5 days previous resulting in a T12 compression fracture.  She was prescribed a back brace and hydrocodone and started on Aricept.  Workup in the ER showed a right lower lobe infiltrate with small right effusion.  CT of the brain showed a nonspecific right posterior frontal convict sits at the abnormality versus artifact.  Family had noticed some right facial droop on February 18.  MRI of the brain was negative.  Patient's hypnotic medications were decreased during hospitalization her mental status improved.  She is been discharged with 4 more days of oral antibiotics.  Patient has had a fall already in the facility-x-rays do not appear to show any acute changes and does show what is suspected be chronic compression fractures and thoracic spine at this point her pain appears to be relatively well controlled.  Per discussion with family they are considering hospice they say her status has steadily declined here in a fairly quick fashion-.  Family is very supportive and actually are with her pretty much around the clock.  Previous medical history.  History of an supple left of the thumb multi-factor II infectious process and medications.  Community-acquired pneumonia.  T12 compression fracture.  Dementia.  Hypokalemia.  Family medical social history reviewed per discharge note on 08/30/2015  No history of tobacco alcohol or illicit drug use.  Apparently patient had been fairly independent until fairly recently.  Medications.  Xanax 0.5 mg twice a day when  necessary.  Aspirin enteric-coated 81 mg daily.  Azithromycin 500 mg daily.  Omnicef 3 mg twice a day.  Aricept 5 mg daily at bedtime.  Norco 5-3 25 mg 1 tab every 6 hours when necessary.  Multivitamin daily.  Zyprexa 2.5 mg daily at bedtime.  Review of systems this is quite limited secondary to dementia.  Provided by nursing family and patient.  In general no complaints of fever or chills.  Skin no complaints of rashes or itching.  Head ears eyes nose mouth and throat does not complain of sore throat or nasal discharge.  Respiratory does not complain of shortness of breath or.  Cardiac no chest pain.  GI is not complaining of abdominal discomfort no diarrhea or constipation apparently noted.  GU no complaints of dysuria.  Muscle skeletal does have at times back pain with history of thoracic fractures. According family also is complaining of some left lower thorax discomfort  Neurologic is not complaining of dizziness or headache.  Psych does have a history of anxiety and dementia which appears to be progressing.  Physical exam.  Temperature is 97.1 pulse 71 respirations 20 blood pressure 138/63.  In general this is a somewhat frail appearing female look she underwent her stated age.  Her skin is warm and dry.  Eyes pupils appear reactive to light sclerae are clear visual acuity appears grossly intact.  Oropharynx is clear mucous membranes fairly moist.  Chest is clear to auscultation there is no labored breathing.  Heart is regular rate and rhythm without murmur gallop or rub she has mild lower extremity edema pedal pulses are present bilaterally.  Her abdomen soft nontender with  positive bowel sounds.  Musculoskeletal she is ambulating in a wheelchair moves all extremities 4 there is no acute tenderness to palpation or deformity noted to her back or pain with passive range of motion of her hip and legs. Possibly some mild tenderness to palpation of the  left lower thorax area I do not see any deformity  Neurologic cranial nerves appear grossly intact her speech is clear.  Psych she is oriented to self is somewhat agitated with exam at times  Labs.  Feb  i 24th 2017.  Sodium 143 potassium 3.6 BUN 29 creatinine 0.76.  WBC 7.4 hemoglobin 12.7 platelets 238.  08/27/2015.  Liver function tests within normal limits.  Assessment plan.  #1 history of acute encephalopathy thought to be multifactorial with history of suspected pneumonia she is completing a course of azithromycin also thought to be medication related and Norco and Ambien were discontinued.  Xanax dose was reduced to 0.5 mg twice a day when necessary.  She continues to have some agitation at times of suspect this may be due as well to her dementia.  Per family she has regressed here fairly significantly in a fairly short period of time-they are considering hospice-this will warrant follow up Dr. Leanord Hawking will see patient tomorrow as well-currently she does not appear to be in any distress-family is with her fairly around-the-clock since she continues to be a fall risk with her confusion and agitation at times.  #2-dementia as noted above she is on Aricept as well as Zyprexa apparently this is progressing fairly rapidly according to family.  #3 back pain with history of falls and compression fractures x-rays have not really shown any acute changes it appears-we will x-ray however thorax area abdominal area secondary to the lower thorax discomfort on the left-appears her Norco has been restarted 5-3 25 mg every 6 hours when necessary this will have to be monitored.  #4-pneumonia she is completing a course of Omnicef and Zithromax this appears to be fairly stable no really note significant cough or congestion on exam today.  Of note Will update a CBC and metabolic panel as well as magnesium level next week--I do note magnesium was borderline  Low  at 1.8 in the  hospital   [id number]-of note greater than 35 minutes spent assessing patient-reviewing her chart-discussing her status with her family-and coordinating and formulating a plan of care for numerous diagnoses-notre  greater than 50% of time spent coordinating plan of care

## 2015-09-03 ENCOUNTER — Inpatient Hospital Stay (HOSPITAL_COMMUNITY): Payer: Medicare Other | Attending: Internal Medicine

## 2015-09-03 ENCOUNTER — Non-Acute Institutional Stay (SKILLED_NURSING_FACILITY): Payer: Medicare Other | Admitting: Internal Medicine

## 2015-09-03 DIAGNOSIS — R4701 Aphasia: Secondary | ICD-10-CM | POA: Diagnosis not present

## 2015-09-03 DIAGNOSIS — J189 Pneumonia, unspecified organism: Secondary | ICD-10-CM | POA: Diagnosis not present

## 2015-09-03 DIAGNOSIS — W19XXXD Unspecified fall, subsequent encounter: Secondary | ICD-10-CM | POA: Diagnosis not present

## 2015-09-03 DIAGNOSIS — F0391 Unspecified dementia with behavioral disturbance: Secondary | ICD-10-CM | POA: Diagnosis not present

## 2015-09-03 NOTE — Progress Notes (Signed)
Patient ID: Tracy Mccoy, female   DOB: 05-09-1923, 82 y.o.   MRN: [id number]     Facility; [facility] Chief complaint; admission to SNF post admit to [facility] from 2/18 to 08/31/15  History; this is a 82 year old woman who lives in [place] with frequent daily visits from a son. She was brought to the emergency room after being found in her bedroom after a fall. She is related tear rated a lot in the last 2 weeks per her son. She had not been running a fever coughing up. Her temperature was 101 in the ER. She was treated with Tamiflu and antibiotics as her x-ray showed right base opacification. She also had a urine that showed Escherichia coli. She does not have a history of falls does not use a cane or walker  I spoke at length with her son with regards to a history of her dementia. They note that this started with stumbling over words. She was living with his son in [place] and moved to [place] year ago to be closer to his son who was retired. She is still able to dress herself, bathe herself, toilet herself. Family was bringing prepared meals over and stopping in on her 4 times a day. She did not collateral on her own she could not manage her money.  CBC Latest Ref Rng 09/02/2015 08/28/2015 08/27/2015  WBC 4.0 - 10.5 K/uL 7.4 5.8 5.6  Hemoglobin 12.0 - 15.0 g/dL 47.8 29.5 62.1  Hematocrit 36.0 - 46.0 % 39.0 37.5 40.2  Platelets 150 - 400 K/uL 238 261 257    CMP Latest Ref Rng 09/02/2015 08/29/2015 08/28/2015  Glucose 65 - 99 mg/dL 308(M) 578(I) 94  BUN 6 - 20 mg/dL 69(G) 11 15  Creatinine 0.44 - 1.00 mg/dL 2.95 2.84 1.32  Sodium 135 - 145 mmol/L 143 140 142  Potassium 3.5 - 5.1 mmol/L 3.6 3.8 3.3(L)  Chloride 101 - 111 mmol/L 107 106 107  CO2 22 - 32 mmol/L Calcium 8.9 - 10.3 mg/dL 4.4(W) 8.9 1.0(U)  Total Protein 6.5 - 8.1 g/dL - - -  Total Bilirubin 0.3 - 1.2 mg/dL - - -  Alkaline Phos 38 - 126 U/L - - -  AST 15 - 41 U/L - - -  ALT 14 - 54 U/L - - -      Past Medical History  Diagnosis Date  . Dementia      No past surgical history on file.   Current Outpatient Prescriptions on File Prior to Visit  Medication Sig Dispense Refill  . ALPRAZolam (XANAX) 0.5 MG tablet Take 1 tablet (0.5 mg total) by mouth 2 (two) times daily as needed. 30 tablet 0  . aspirin EC 81 MG tablet Take 81 mg by mouth daily.    Marland Kitchen azithromycin (ZITHROMAX) 500 MG tablet Take 1 tablet (500 mg total) by mouth daily. 4 tablet 0  . cefdinir (OMNICEF) 300 MG capsule Take 1 capsule (300 mg total) by mouth 2 (two) times daily. 8 capsule 0  . donepezil (ARICEPT) 5 MG tablet Take 5 mg by mouth at bedtime.    Marland Kitchen HYDROcodone-acetaminophen (NORCO/VICODIN) 5-325 MG tablet Take 1 tablet by mouth every 6 (six) hours as needed for moderate pain. 30 tablet 0  . Multiple Vitamin (MULTIVITAMIN WITH MINERALS) TABS tablet Take 1 tablet by mouth daily.    Marland Kitchen OLANZapine (ZYPREXA) 2.5 MG tablet Take 2.5 mg by mouth at bedtime.  Social History; The patient actually lives alone with very frequent visits from family. This is up to 4 times a day. They prepare her meals. Other than that she seems independent with ADLs by description of her son. She does not go out of the home cannot manage her finances. She does not use a cane or walker  No family history on file.  Review of systems; this is not really possible in this patient  Physical examination Gen.; the patient appears healthy and younger than her stated age. HEENT oral exam is normal Respiratory; clear entry bilaterally Cardiac heart sounds are normal no murmurs or gallops she appears to be euvolemic Abdomen; slightly distended no liver spleen or masses noted GU bladder is not enlarged there is no CVA tenderness Musculoskeletal; no active joints are seen Skin; no pressure areas in the usual spots Neurologic; cranial nerves seem intact, visual fields are normal, no pronator drift. Strength seems equal bilaterally, really  difficult to gauge this but I suspect it is normal reflexes are normal toes are downgoing there is no Hoffman's reflex Gait, somewhat short stepped and tentative but otherwise not pathognomonic. I suspect a lot of this is probably disuse/prolonged recumbency Speech; this patient has both receptive and expressive language disturbance. Word finding difficulties. Confrontational naming difficulties. Left right confusion  Impression/plan #1 dementia; as opposed to what was said in the hospital this is not severe and the interpretation of it is hampered by both expressive and receptive language difficulties. This would account for why the patient is still reasonably independent with ADLs. Most likely diagnosis here is a variant of Alzheimer's disease in which language is affected out of proportion to memory and a initial presentation of the disease. Alzheimer's disease would be much more likely than the rare neurodegenerative disorders that affect language predominantly and I do think that she has a memory problem as well as other cognitive domain afflictions. I agree with Aricept. I am not certain why she is on Zyprexa and if we can clarify this this should be discontinued #2 community-acquired pneumonia this appears to be making good progress #3 UTI Escherichia coli again current antibiotics seem to have been affective #4 gait ataxia; I think this is disuse and should respond to physical therapy. Balance is poor but again this could be because of prolonged recumbency  #5 when necessary Xanax should probably be stopped #6 the patient should not be living in her own home alone and I told this to her son. Options are to have somebody living with her, move her to live with one of the family members or an assisted living.  #7 son is concerned that she has hearing loss. This would be a difficult interpretation due to language issues but pure tone audiometry might be able to be done to see if hearing loss plays a  role in this

## 2015-09-05 ENCOUNTER — Encounter (HOSPITAL_COMMUNITY)
Admission: RE | Admit: 2015-09-05 | Discharge: 2015-09-05 | Disposition: A | Discharge status: Home / Self Care | Payer: Medicare Other | Source: Skilled Nursing Facility | Attending: Internal Medicine | Admitting: Internal Medicine

## 2015-09-05 LAB — CBC WITH DIFFERENTIAL/PLATELET
BASOS ABS: 0 10*3/uL (ref 0.0–0.1)
BASOS PCT: 1 %
EOS ABS: 0.3 10*3/uL (ref 0.0–0.7)
EOS PCT: 4 %
HCT: 37.4 % (ref 36.0–46.0)
Hemoglobin: 12.2 g/dL (ref 12.0–15.0)
LYMPHS ABS: 2 10*3/uL (ref 0.7–4.0)
Lymphocytes Relative: 26 %
MCH: 30.5 pg (ref 26.0–34.0)
MCHC: 32.6 g/dL (ref 30.0–36.0)
MCV: 93.5 fL (ref 78.0–100.0)
Monocytes Absolute: 0.8 10*3/uL (ref 0.1–1.0)
Monocytes Relative: 11 %
Neutro Abs: 4.6 10*3/uL (ref 1.7–7.7)
Neutrophils Relative %: 58 %
PLATELETS: 221 10*3/uL (ref 150–400)
RBC: 4 MIL/uL (ref 3.87–5.11)
RDW: 13.1 % (ref 11.5–15.5)
WBC: 7.8 10*3/uL (ref 4.0–10.5)

## 2015-09-05 LAB — BASIC METABOLIC PANEL
Anion gap: 6 (ref 5–15)
BUN: 28 mg/dL — AB (ref 6–20)
CALCIUM: 8.4 mg/dL — AB (ref 8.9–10.3)
CO2: 28 mmol/L (ref 22–32)
CREATININE: 0.64 mg/dL (ref 0.44–1.00)
Chloride: 109 mmol/L (ref 101–111)
Glucose, Bld: 100 mg/dL — ABNORMAL HIGH (ref 65–99)
Potassium: 4.1 mmol/L (ref 3.5–5.1)
SODIUM: 143 mmol/L (ref 135–145)

## 2015-09-05 LAB — MAGNESIUM: MAGNESIUM: 2.1 mg/dL (ref 1.7–2.4)

## 2015-09-07 ENCOUNTER — Other Ambulatory Visit: Payer: Self-pay | Admitting: *Deleted

## 2015-09-07 MED ORDER — TRAMADOL HCL 50 MG PO TABS: ORAL_TABLET | ORAL | Status: DC

## 2015-09-07 NOTE — Telephone Encounter (Signed)
Received fax from [facility] stated that patient needed a Rx for Tramadol sent to [facility] pharmacy Fax: [phone]. Printed and faxed.

## 2015-09-16 ENCOUNTER — Non-Acute Institutional Stay (SKILLED_NURSING_FACILITY): Payer: Medicare Other | Admitting: Internal Medicine

## 2015-09-16 DIAGNOSIS — M4854XD Collapsed vertebra, not elsewhere classified, thoracic region, subsequent encounter for fracture with routine healing: Secondary | ICD-10-CM | POA: Diagnosis not present

## 2015-09-16 DIAGNOSIS — N10 Acute tubulo-interstitial nephritis: Secondary | ICD-10-CM

## 2015-09-16 DIAGNOSIS — F0391 Unspecified dementia with behavioral disturbance: Secondary | ICD-10-CM

## 2015-09-16 DIAGNOSIS — S22080D Wedge compression fracture of T11-T12 vertebra, subsequent encounter for fracture with routine healing: Secondary | ICD-10-CM

## 2015-09-16 DIAGNOSIS — J189 Pneumonia, unspecified organism: Secondary | ICD-10-CM

## 2015-09-16 NOTE — Progress Notes (Signed)
Patient ID: Tracy Mccoy, female   DOB: 1922-10-05, 82 y.o.   MRN: [id number]     this is a discharge note Facility; [facility] Chief complaint; discharge note  HPI-- this is a 82 year old woman who lives in [place] with frequent daily visits from a son. She was brought to the emergency room after being found in her bedroom after a fall. . Her temperature was 101 in the ER. She was treated with Tamiflu and antibiotics as her x-ray showed right base opacification. She also had a urine that showed Escherichia coli. She does not have a history of falls does not use a cane or walker  Dr. Leanord Hawking spoke  at length with her son with regards to a history of her dementia. They note that this started with stumbling over words. She was living with his son in [place] and moved to [place] year ago to be closer to his son who was retired. She was still able to dress herself, bathe herself, toilet herself. Family was bringing prepared meals over and stopping in on her 4 times a day.  she could not manage her money.  She has done relatively well here in issue there were issues with agitation but this appears to have gotten better she is receiving Ativan twice a day-she is also on low-dose Aricept 5 mg a day-. And low-dose Zyprexa 2.5 mg daily at bedtime  For history compression fracture she is on tramadol 50 mg twice a day-as well as Norco 5-3 25 mg every 6 hours when necessary  Patient will be going home to live with her sons-who are extremely supportive. She has had family with her pretty much around the clock since she has been here.  In regards to UTI pneumonia these appear to have resolved fairly unremarkably she does not complain of dysuria no increased cough congestion or shortness of breath has been noted-it appears she's looking very much forward to going home with her sons.  Blood work appears to be fairly stable we will have this rechecked by home health shortly after  discharge   09/05/2015.  Sodium 143 potassium 4.1 BUN 28 creatinine 0.64.  WBC 7.8 hemoglobin 12.2 platelets 221.  Magnesium 2.1 CBC Latest Ref Rng 09/02/2015 08/28/2015 08/27/2015  WBC 4.0 - 10.5 K/uL 7.4 5.8 5.6  Hemoglobin 12.0 - 15.0 g/dL 40.9 81.1 91.4  Hematocrit 36.0 - 46.0 % 39.0 37.5 40.2  Platelets 150 - 400 K/uL 238 261 257    CMP Latest Ref Rng 09/02/2015 08/29/2015 08/28/2015  Glucose 65 - 99 mg/dL 782(N) 562(Z) 94  BUN 6 - 20 mg/dL 30(Q) 11 15  Creatinine 0.44 - 1.00 mg/dL 6.57 8.46 9.62  Sodium 135 - 145 mmol/L 143 140 142  Potassium 3.5 - 5.1 mmol/L 3.6 3.8 3.3(L)  Chloride 101 - 111 mmol/L 107 106 107  CO2 22 - 32 mmol/L Calcium 8.9 - 10.3 mg/dL 9.5(M) 8.9 8.4(X)  Total Protein 6.5 - 8.1 g/dL - - -  Total Bilirubin 0.3 - 1.2 mg/dL - - -  Alkaline Phos 38 - 126 U/L - - -  AST 15 - 41 U/L - - -  ALT 14 - 54 U/L - - -    Past Medical History  Diagnosis Date  . Dementia      No past surgical history on file.   Current Outpatient Prescriptions on File Prior to Visit  Medication Sig Dispense Refill  .  Ativan-0.25 mg twice a  day   30 tablet 0  . aspirin EC 81 MG tablet Take 81 mg by mouth daily.    Marland Kitchen     0  .  Ultram 50 mg twice a day       . donepezil (ARICEPT) 5 MG tablet Take 5 mg by mouth at bedtime.    Marland Kitchen HYDROcodone-acetaminophen (NORCO/VICODIN) 5-325 MG tablet Take 1 tablet by mouth every 6 (six) hours as needed for moderate pain. 30 tablet 0  . Multiple Vitamin (MULTIVITAMIN WITH MINERALS) TABS tablet Take 1 tablet by mouth daily.    Marland Kitchen OLANZapine (ZYPREXA) 2.5 MG tablet Take 2.5 mg by mouth at bedtime.       Social History; The patient actually lives alone with very frequent visits from family. This is up to 4 times a day. They prepared her meals. Other than that she seems independent with ADLs by description of her son. She does not go out of the home cannot manage her finances. She does not use a cane or walker  No family history  on file.  Review of systems; is quite limited --patient a poor historian --largely provided by nursing and family.  General no complaints of fever chills.  Respiratory does not complain of increased shortness of breath or cough.  Cardiac no noted chest pain.  GI no noted nausea vomiting diarrhea constipation or abdominal discomfort.  GU completed treatment for UTI does not complain of dysuria.  Muscle skeletal has had significant back pain issues although this appears to have gotten somewhat better on current medications.  Neurologic again history of dementia  Physical examination Temperature 97.4 pulse 79 respirations 20 blood pressure 129/59 Gen.; the patient appears healthy and younger than her stated age. HEENT oral exam is normal Respiratory; clear entry bilaterally Cardiac heart sounds are normal no murmurs or gallops she appears to be euvolemic minimal lower extremity edema Abdomen; slightly distended positive bowel sounds could not really appreciate tenderness  Musculoskeletal; no active joints are seen she is ambulatory S Neurologic; cranial nerves seem intact, visual fields are normal,  Strength seems equal bilaterally, really difficult to gauge this   Gait, somewhat short stepped and tentative  Speech; this patient has both receptive and expressive language disturbance. Word finding difficulties She is pleasant and cooperative with exam when prompted she does follow some commands  Impression/plan #1 dementia;  Per Dr. Jannetta Quint previous assessment--  Most likely diagnosis here is a variant of Alzheimer's disease in which language is affected out of proportion to memory and a initial presentation of the disease. Alzheimer's disease would be much more likely than the rare neurodegenerative disorders that affect language --this point continue Aricept-Dr. Leanord Hawking was questioning the Zyprexa but will defer that to primary care provider since she is about to be discharged  would be hesitant to make significant medication changes  I note she does continue on Ativan twice a day again will defer to primary care provider on this #2 community-acquired pneumonia this appears to be making good progress currently asymptomatic #3 UTI Escherichia coli asked likewise this appears to have largely resolved #4 gait ataxia; --she will need continued PT and OT-as an outpatient-family feel she would do somewhat better at home in familiar surroundings she does have extremely strong family support she will be with her sons who are very attentive #5 history compression fractures-again she continues with the tramadol routinely and Norco as needed she appears to be doing a better in this regards  Patient does appear to  have improved since her initial presentation here-she will need continued PT and OT-she has strong family support-home health to draw CBC and metabolic panel on Monday, March 13 and notify primary care provider of results.  [id number]-of note greater than 30 minutes spent on this discharge summary--greater than 50% of time spent coordinating plan of care for numerous diagnoses

## 2015-09-19 ENCOUNTER — Other Ambulatory Visit (HOSPITAL_COMMUNITY)
Admission: AD | Admit: 2015-09-19 | Discharge: 2015-09-19 | Disposition: A | Discharge status: Home / Self Care | Payer: Medicare Other | Source: Skilled Nursing Facility | Attending: Internal Medicine | Admitting: Internal Medicine

## 2015-09-19 DIAGNOSIS — R591 Generalized enlarged lymph nodes: Secondary | ICD-10-CM | POA: Diagnosis not present

## 2015-09-19 DIAGNOSIS — M6281 Muscle weakness (generalized): Secondary | ICD-10-CM | POA: Diagnosis present

## 2015-09-19 DIAGNOSIS — N390 Urinary tract infection, site not specified: Secondary | ICD-10-CM | POA: Diagnosis not present

## 2015-09-19 DIAGNOSIS — J189 Pneumonia, unspecified organism: Secondary | ICD-10-CM | POA: Diagnosis not present

## 2015-09-19 DIAGNOSIS — M4854XD Collapsed vertebra, not elsewhere classified, thoracic region, subsequent encounter for fracture with routine healing: Secondary | ICD-10-CM | POA: Diagnosis not present

## 2015-09-19 LAB — CBC WITH DIFFERENTIAL/PLATELET
Basophils Absolute: 0 10*3/uL (ref 0.0–0.1)
Basophils Relative: 1 %
Eosinophils Absolute: 0.1 10*3/uL (ref 0.0–0.7)
Eosinophils Relative: 2 %
HEMATOCRIT: 38.1 % (ref 36.0–46.0)
HEMOGLOBIN: 12.2 g/dL (ref 12.0–15.0)
LYMPHS ABS: 1.9 10*3/uL (ref 0.7–4.0)
LYMPHS PCT: 30 %
MCH: 30.4 pg (ref 26.0–34.0)
MCHC: 32 g/dL (ref 30.0–36.0)
MCV: 95 fL (ref 78.0–100.0)
MONOS PCT: 11 %
Monocytes Absolute: 0.7 10*3/uL (ref 0.1–1.0)
NEUTROS ABS: 3.5 10*3/uL (ref 1.7–7.7)
NEUTROS PCT: 56 %
Platelets: 277 10*3/uL (ref 150–400)
RBC: 4.01 MIL/uL (ref 3.87–5.11)
RDW: 13.3 % (ref 11.5–15.5)
WBC: 6.1 10*3/uL (ref 4.0–10.5)

## 2015-09-19 LAB — BASIC METABOLIC PANEL
Anion gap: 7 (ref 5–15)
BUN: 17 mg/dL (ref 6–20)
CHLORIDE: 104 mmol/L (ref 101–111)
CO2: 27 mmol/L (ref 22–32)
CREATININE: 0.62 mg/dL (ref 0.44–1.00)
Calcium: 9 mg/dL (ref 8.9–10.3)
GFR calc non Af Amer: >60 mL/min (ref >60)
Glucose, Bld: 95 mg/dL (ref 65–99)
POTASSIUM: 4.5 mmol/L (ref 3.5–5.1)
Sodium: 138 mmol/L (ref 135–145)

## 2015-09-21 ENCOUNTER — Encounter: Payer: Self-pay | Admitting: Internal Medicine

## 2015-09-27 ENCOUNTER — Ambulatory Visit: Payer: Medicare Other | Admitting: Orthopaedic Surgery

## 2015-10-05 ENCOUNTER — Emergency Department (HOSPITAL_COMMUNITY): Payer: Medicare Other

## 2015-10-05 ENCOUNTER — Inpatient Hospital Stay (HOSPITAL_COMMUNITY)
Admission: EM | Admit: 2015-10-05 | Discharge: 2015-10-11 | DRG: 689 | Disposition: A | Discharge status: Skilled Nursing Facility | Payer: Medicare Other | Attending: Internal Medicine | Admitting: Internal Medicine

## 2015-10-05 ENCOUNTER — Inpatient Hospital Stay (HOSPITAL_COMMUNITY): Payer: Medicare Other

## 2015-10-05 ENCOUNTER — Encounter (HOSPITAL_COMMUNITY): Payer: Self-pay | Admitting: Emergency Medicine

## 2015-10-05 DIAGNOSIS — J90 Pleural effusion, not elsewhere classified: Secondary | ICD-10-CM

## 2015-10-05 DIAGNOSIS — M4854XD Collapsed vertebra, not elsewhere classified, thoracic region, subsequent encounter for fracture with routine healing: Secondary | ICD-10-CM | POA: Diagnosis not present

## 2015-10-05 DIAGNOSIS — W19XXXA Unspecified fall, initial encounter: Secondary | ICD-10-CM

## 2015-10-05 DIAGNOSIS — F0391 Unspecified dementia with behavioral disturbance: Secondary | ICD-10-CM | POA: Diagnosis present

## 2015-10-05 DIAGNOSIS — M4854XA Collapsed vertebra, not elsewhere classified, thoracic region, initial encounter for fracture: Secondary | ICD-10-CM | POA: Diagnosis not present

## 2015-10-05 DIAGNOSIS — G8929 Other chronic pain: Secondary | ICD-10-CM | POA: Diagnosis present

## 2015-10-05 DIAGNOSIS — N390 Urinary tract infection, site not specified: Principal | ICD-10-CM | POA: Diagnosis present

## 2015-10-05 DIAGNOSIS — R296 Repeated falls: Secondary | ICD-10-CM | POA: Diagnosis present

## 2015-10-05 DIAGNOSIS — G9340 Encephalopathy, unspecified: Secondary | ICD-10-CM | POA: Diagnosis present

## 2015-10-05 DIAGNOSIS — Z7982 Long term (current) use of aspirin: Secondary | ICD-10-CM

## 2015-10-05 DIAGNOSIS — W19XXXD Unspecified fall, subsequent encounter: Secondary | ICD-10-CM | POA: Diagnosis not present

## 2015-10-05 DIAGNOSIS — S22080A Wedge compression fracture of T11-T12 vertebra, initial encounter for closed fracture: Secondary | ICD-10-CM | POA: Diagnosis present

## 2015-10-05 LAB — URINALYSIS, ROUTINE W REFLEX MICROSCOPIC
Bilirubin Urine: NEGATIVE
GLUCOSE, UA: NEGATIVE mg/dL
Ketones, ur: NEGATIVE mg/dL
Nitrite: NEGATIVE
PH: 7 (ref 5.0–8.0)
Protein, ur: NEGATIVE mg/dL
SPECIFIC GRAVITY, URINE: 1.01 (ref 1.005–1.030)

## 2015-10-05 LAB — CBC
HEMATOCRIT: 38.8 % (ref 36.0–46.0)
HEMOGLOBIN: 12.4 g/dL (ref 12.0–15.0)
MCH: 30 pg (ref 26.0–34.0)
MCHC: 32 g/dL (ref 30.0–36.0)
MCV: 93.9 fL (ref 78.0–100.0)
Platelets: 233 10*3/uL (ref 150–400)
RBC: 4.13 MIL/uL (ref 3.87–5.11)
RDW: 13.3 % (ref 11.5–15.5)
WBC: 5.5 10*3/uL (ref 4.0–10.5)

## 2015-10-05 LAB — COMPREHENSIVE METABOLIC PANEL
ALBUMIN: 3.5 g/dL (ref 3.5–5.0)
ALK PHOS: 81 U/L (ref 38–126)
ALT: 17 U/L (ref 14–54)
ANION GAP: 8 (ref 5–15)
AST: 22 U/L (ref 15–41)
BUN: 15 mg/dL (ref 6–20)
CALCIUM: 8.7 mg/dL — AB (ref 8.9–10.3)
CO2: 27 mmol/L (ref 22–32)
Chloride: 107 mmol/L (ref 101–111)
Creatinine, Ser: 0.76 mg/dL (ref 0.44–1.00)
GFR calc Af Amer: >60 mL/min (ref >60)
GFR calc non Af Amer: >60 mL/min (ref >60)
GLUCOSE: 106 mg/dL — AB (ref 65–99)
POTASSIUM: 4 mmol/L (ref 3.5–5.1)
SODIUM: 142 mmol/L (ref 135–145)
Total Bilirubin: 0.6 mg/dL (ref 0.3–1.2)
Total Protein: 7.5 g/dL (ref 6.5–8.1)

## 2015-10-05 LAB — URINE MICROSCOPIC-ADD ON

## 2015-10-05 MED ORDER — HALOPERIDOL LACTATE 5 MG/ML IJ SOLN
1.0000 mg | Freq: Four times a day (QID) | INTRAMUSCULAR | Status: DC | PRN
  Administered 2015-10-05 – 2015-10-06 (×5): 1 mg via INTRAVENOUS
  Filled 2015-10-05 (×6): qty 1

## 2015-10-05 MED ORDER — LORAZEPAM 2 MG/ML IJ SOLN
INTRAMUSCULAR | Status: AC
  Administered 2015-10-05: 1 mg via INTRAVENOUS
  Filled 2015-10-05: qty 1

## 2015-10-05 MED ORDER — SODIUM CHLORIDE 0.9 % IV BOLUS (SEPSIS)
1000.0000 mL | Freq: Once | INTRAVENOUS | Status: AC
  Administered 2015-10-05: 1000 mL via INTRAVENOUS

## 2015-10-05 MED ORDER — DOCUSATE SODIUM 100 MG PO CAPS
100.0000 mg | ORAL_CAPSULE | Freq: Two times a day (BID) | ORAL | Status: DC
  Administered 2015-10-05 – 2015-10-10 (×9): 100 mg via ORAL
  Filled 2015-10-05 (×10): qty 1

## 2015-10-05 MED ORDER — ONDANSETRON HCL 4 MG PO TABS: 4.0000 mg | ORAL_TABLET | Freq: Four times a day (QID) | ORAL | Status: DC | PRN

## 2015-10-05 MED ORDER — DEXTROSE 5 % IV SOLN
500.0000 mg | INTRAVENOUS | Status: DC
  Administered 2015-10-05 – 2015-10-08 (×4): 500 mg via INTRAVENOUS
  Filled 2015-10-05 (×6): qty 500

## 2015-10-05 MED ORDER — ACETAMINOPHEN 325 MG PO TABS
650.0000 mg | ORAL_TABLET | Freq: Four times a day (QID) | ORAL | Status: DC | PRN
Start: 2015-10-05 — End: 2015-10-11
  Filled 2015-10-05: qty 2

## 2015-10-05 MED ORDER — BISACODYL 5 MG PO TBEC
5.0000 mg | DELAYED_RELEASE_TABLET | Freq: Every day | ORAL | Status: DC | PRN
  Filled 2015-10-05: qty 1

## 2015-10-05 MED ORDER — DEXTROSE 5 % IV SOLN
1.0000 g | Freq: Once | INTRAVENOUS | Status: AC
  Administered 2015-10-05: 1 g via INTRAVENOUS
  Filled 2015-10-05: qty 10

## 2015-10-05 MED ORDER — ACETAMINOPHEN 650 MG RE SUPP: 650.0000 mg | Freq: Four times a day (QID) | RECTAL | Status: DC | PRN

## 2015-10-05 MED ORDER — SODIUM CHLORIDE 0.9 % IV BOLUS (SEPSIS): 30.0000 mL/kg | Freq: Once | INTRAVENOUS | Status: DC

## 2015-10-05 MED ORDER — SODIUM CHLORIDE 0.9% FLUSH
3.0000 mL | Freq: Two times a day (BID) | INTRAVENOUS | Status: DC
  Administered 2015-10-05 – 2015-10-10 (×6): 3 mL via INTRAVENOUS

## 2015-10-05 MED ORDER — OLANZAPINE 5 MG PO TABS
2.5000 mg | ORAL_TABLET | Freq: Every day | ORAL | Status: DC
  Administered 2015-10-05 – 2015-10-10 (×5): 2.5 mg via ORAL
  Filled 2015-10-05 (×5): qty 1

## 2015-10-05 MED ORDER — DONEPEZIL HCL 5 MG PO TABS
5.0000 mg | ORAL_TABLET | Freq: Every day | ORAL | Status: DC
  Administered 2015-10-05 – 2015-10-10 (×5): 5 mg via ORAL
  Filled 2015-10-05 (×5): qty 1

## 2015-10-05 MED ORDER — ADULT MULTIVITAMIN W/MINERALS CH
1.0000 | ORAL_TABLET | Freq: Every day | ORAL | Status: DC
  Administered 2015-10-06 – 2015-10-10 (×5): 1 via ORAL
  Filled 2015-10-05 (×6): qty 1

## 2015-10-05 MED ORDER — SENNA 8.6 MG PO TABS
1.0000 | ORAL_TABLET | Freq: Two times a day (BID) | ORAL | Status: DC
  Administered 2015-10-05 – 2015-10-10 (×10): 8.6 mg via ORAL
  Filled 2015-10-05 (×11): qty 1

## 2015-10-05 MED ORDER — ASPIRIN EC 81 MG PO TBEC
81.0000 mg | DELAYED_RELEASE_TABLET | Freq: Every day | ORAL | Status: DC
  Administered 2015-10-06 – 2015-10-10 (×5): 81 mg via ORAL
  Filled 2015-10-05 (×6): qty 1

## 2015-10-05 MED ORDER — DEXTROSE 5 % IV SOLN: 1.0000 g | INTRAVENOUS | Status: DC

## 2015-10-05 MED ORDER — LORAZEPAM 1 MG PO TABS
1.0000 mg | ORAL_TABLET | Freq: Two times a day (BID) | ORAL | Status: DC | PRN
  Filled 2015-10-05: qty 1

## 2015-10-05 MED ORDER — LORAZEPAM 2 MG/ML IJ SOLN
1.0000 mg | Freq: Once | INTRAMUSCULAR | Status: AC
  Administered 2015-10-05: 1 mg via INTRAVENOUS

## 2015-10-05 MED ORDER — DEXTROSE 5 % IV SOLN
1.0000 g | INTRAVENOUS | Status: DC
  Administered 2015-10-06 – 2015-10-11 (×6): 1 g via INTRAVENOUS
  Filled 2015-10-05 (×8): qty 10

## 2015-10-05 MED ORDER — TRAMADOL HCL 50 MG PO TABS
50.0000 mg | ORAL_TABLET | Freq: Two times a day (BID) | ORAL | Status: DC | PRN
  Administered 2015-10-07 – 2015-10-09 (×3): 50 mg via ORAL
  Filled 2015-10-05 (×5): qty 1

## 2015-10-05 MED ORDER — ONDANSETRON HCL 4 MG/2ML IJ SOLN: 4.0000 mg | Freq: Four times a day (QID) | INTRAMUSCULAR | Status: DC | PRN

## 2015-10-05 MED ORDER — SODIUM CHLORIDE 0.9 % IV SOLN
INTRAVENOUS | Status: DC
  Administered 2015-10-05 – 2015-10-11 (×3): via INTRAVENOUS

## 2015-10-05 NOTE — ED Notes (Signed)
IV found in bed. Intact, removed by patient.

## 2015-10-05 NOTE — ED Notes (Signed)
Patient transferred to chair from bed. Confused. Attempted to feed patient lunch. Patient unable to eat right now even with RN direction.

## 2015-10-05 NOTE — ED Notes (Signed)
Pt becoming very agitated and aggressive to nursing staff, pt swinging arms at staff and kicking feet, pt pulled IV out. Dr. Adriana Simasook notified and order given to give Ativan 1mg  IV.

## 2015-10-05 NOTE — ED Provider Notes (Signed)
CSN: [id number]     Arrival date & time 10/05/15  0846 History   First MD Initiated Contact with Patient 10/05/15 [phone]     Chief Complaint  Patient presents with  . Altered Mental Status     (Consider location/radiation/quality/duration/timing/severity/associated sxs/prior Treatment) HPI..... Level V caveat for dementia. Patient presents with altered mental status. NO family members at bedside. Per EMS notes, family reports increased confusion, foul-smelling urine for several days. Nursing notes reported fall 2 weeks ago.  Past Medical History  Diagnosis Date  . Dementia    History reviewed. No pertinent past surgical history. History reviewed. No pertinent family history. Social History  Substance Use Topics  . Smoking status: Never Smoker   . Smokeless tobacco: None  . Alcohol Use: No   OB History    No data available     Review of Systems  All other systems reviewed and are negative.     Allergies  Review of patient's allergies indicates no known allergies.  Home Medications   Prior to Admission medications   Medication Sig Start Date End Date Taking? Authorizing Provider  ALPRAZolam Prudy Feeler(XANAX) 1 MG tablet Take 1 mg by mouth 2 (two) times daily as needed for anxiety.   Yes Historical Provider, MD  donepezil (ARICEPT) 5 MG tablet Take 5 mg by mouth at bedtime.   Yes Historical Provider, MD  HYDROcodone-acetaminophen (NORCO/VICODIN) 5-325 MG tablet Take 1 tablet by mouth every 6 (six) hours as needed for moderate pain. 08/30/15  Yes Catarina Hartshornavid Tat, MD  LORazepam (ATIVAN) 1 MG tablet Take 1 mg by mouth 2 (two) times daily as needed for anxiety.   Yes Historical Provider, MD  OLANZapine (ZYPREXA) 2.5 MG tablet Take 2.5 mg by mouth at bedtime.   Yes Historical Provider, MD  zolpidem (AMBIEN) 5 MG tablet Take 5 mg by mouth at bedtime as needed for sleep.   Yes Historical Provider, MD  ALPRAZolam Prudy Feeler(XANAX) 0.5 MG tablet Take 1 tablet (0.5 mg total) by mouth 2 (two) times daily as  needed. Patient not taking: Reported on 10/05/2015 08/30/15   Catarina Hartshornavid Tat, MD  aspirin EC 81 MG tablet Take 81 mg by mouth daily.    Historical Provider, MD  Multiple Vitamin (MULTIVITAMIN WITH MINERALS) TABS tablet Take 1 tablet by mouth daily.    Historical Provider, MD  traMADol (ULTRAM) 50 MG tablet Take one tablet by mouth twice daily for pain 09/07/15   Kirt BoysMonica Carter, DO   BP 151/85 mmHg  Pulse 75  Temp(Src) 97.5 F (36.4 C) (Oral)  Resp 17  Ht 5\' 5"  (1.651 m)  Wt 140 lb (63.504 kg)  BMI 23.30 kg/m2  SpO2 97% Physical Exam  Constitutional:  crying  HENT:  Head: Normocephalic and atraumatic.  Eyes: Conjunctivae are normal. Pupils are equal, round, and reactive to light.  Neck: Normal range of motion. Neck supple.  Cardiovascular: Normal rate and regular rhythm.   Pulmonary/Chest: Effort normal and breath sounds normal.  Abdominal: Soft. Bowel sounds are normal.  Musculoskeletal: Normal range of motion.  Neurological:  Moving all extremitites  Skin: Skin is warm and dry.  Psychiatric:  demented  Nursing note and vitals reviewed.   ED Course  Procedures (including critical care time) Labs Review Labs Reviewed  URINALYSIS, ROUTINE W REFLEX MICROSCOPIC (NOT AT [facility]) - Abnormal; Notable for the following:    Hgb urine dipstick TRACE (*)    Leukocytes, UA MODERATE (*)    All other components within normal limits  COMPREHENSIVE METABOLIC PANEL -  Abnormal; Notable for the following:    Glucose, Bld 106 (*)    Calcium 8.7 (*)    All other components within normal limits  URINE MICROSCOPIC-ADD ON - Abnormal; Notable for the following:    Squamous Epithelial / LPF 0-5 (*)    Bacteria, UA FEW (*)    All other components within normal limits  URINE CULTURE  CBC    Imaging Review Dg Chest Port 1 View  10/05/2015  CLINICAL DATA:  Altered mental status EXAM: PORTABLE CHEST 1 VIEW COMPARISON:  September 03, 2015 FINDINGS: There is opacification apparent pleural thickening along  the inferolateral right hemithorax, new from 1 month prior. Lungs elsewhere are clear. Heart is borderline prominent with pulmonary vascularity within normal limits. No pneumothorax. No adenopathy. There is degenerative change in both shoulders. IMPRESSION: Suspect loculated effusion or possibly hematoma inferolateral right hemithorax. Atypical pneumonia in this area is possible but felt to be less likely. No fracture is evident in this area. No pneumothorax. Left lung is clear. Heart borderline prominent. Follow-up study in 7-10 days advised to further evaluate the opacity in the lateral right hemithorax. Electronically Signed   By: Bretta Bang III M.D.   On: 10/05/2015 14:09   I have personally reviewed and evaluated these images and lab results as part of my medical decision-making.   EKG Interpretation None      MDM   Final diagnoses:  Dementia, with behavioral disturbance  UTI (lower urinary tract infection)  Pleural effusion, right    Patient is demented. Urinalysis shows evidence of infection. Urine culture pending Chest x-ray reviewed. Suspect loculated effusion or possible hematoma in the inferolateral right hemothorax. IV Rocephin, IV Zithromax. Admit to general medicine.    Donnetta Hutching, MD 10/05/15 1434

## 2015-10-05 NOTE — ED Notes (Signed)
Went to complete in and out cath, Pt began urinating. Urinal provided and urine sample collected.

## 2015-10-05 NOTE — H&P (Signed)
[facility] History and Physical  Tracy Mccoy ZOX:[id number] DOB: 09-14-1922 DOA: 10/05/2015  Referring physician: Dr Adriana Simas.  PCP: No primary care provider on file.   Chief Complaint: worsening confusion, agitation, fall  HPI: Tracy Mccoy is a 82 y.o. female with PMH significant for dementia with behavior problems, frequent fall, she was discharge from [facility]  February 21-2017 and treated for encephalopathy thought to be related to infection (pna) and medications Remus Loffler, Norco), she was discharge to SNF, from which patient was release 2 weeks ago. History obtain from son, Tracy Mccoy. He relates that Tracy Mccoy became more confuse and agitated over last few days. Also patient has fallen twice today.  Patient has chronic back pain from compression fracture.  Patient seen in the ED, she is confuse, she is complaining of back pain.   Review of Systems:  Unable to obtain from patient due to confusion, dementia  Past Medical History  Diagnosis Date  . Dementia        Compression Fracture    History reviewed. No pertinent past surgical history. Social History:  reports that she has never smoked. She does not have any smokeless tobacco history on file. She reports that she does not drink alcohol or use illicit drugs.  No Known Allergies  Family History; unable to obtain from patient due to confusion.   Prior to Admission medications   Medication Sig Start Date End Date Taking? Authorizing Provider  ALPRAZolam Prudy Feeler) 1 MG tablet Take 1 mg by mouth 2 (two) times daily as needed for anxiety.   Yes Historical Provider, MD  donepezil (ARICEPT) 5 MG tablet Take 5 mg by mouth at bedtime.   Yes Historical Provider, MD  HYDROcodone-acetaminophen (NORCO/VICODIN) 5-325 MG tablet Take 1 tablet by mouth every 6 (six) hours as needed for moderate pain. 08/30/15  Yes Catarina Hartshorn, MD  LORazepam (ATIVAN) 1 MG tablet Take 1 mg by mouth 2 (two) times daily as needed for anxiety.   Yes Historical  Provider, MD  OLANZapine (ZYPREXA) 2.5 MG tablet Take 2.5 mg by mouth at bedtime.   Yes Historical Provider, MD  zolpidem (AMBIEN) 5 MG tablet Take 5 mg by mouth at bedtime as needed for sleep.   Yes Historical Provider, MD  ALPRAZolam Prudy Feeler) 0.5 MG tablet Take 1 tablet (0.5 mg total) by mouth 2 (two) times daily as needed. Patient not taking: Reported on 10/05/2015 08/30/15   Catarina Hartshorn, MD  aspirin EC 81 MG tablet Take 81 mg by mouth daily.    Historical Provider, MD  Multiple Vitamin (MULTIVITAMIN WITH MINERALS) TABS tablet Take 1 tablet by mouth daily.    Historical Provider, MD  traMADol Janean Sark) 50 MG tablet Take one tablet by mouth twice daily for pain 09/07/15   Kirt Boys, DO   Physical Exam: Filed Vitals:   10/05/15 0930 10/05/15 1130 10/05/15 1215 10/05/15 1346  BP: 174/81 186/93 192/63 151/85  Pulse: 78 85 25 75  Temp:      TempSrc:      Resp: Height:      Weight:      SpO2: 100% 98% 82% 97%    Wt Readings from Last 3 Encounters:  10/05/15 63.504 kg (140 lb)  08/27/15 64.411 kg (142 lb)  08/23/15 64.774 kg (142 lb 12.8 oz)    General:  Appears comfortable Eyes: PERRL, normal lids, irises & conjunctiva ENT: grossly normal hearing, lips & tongue Neck: no LAD, masses or thyromegaly Cardiovascular: RRR, no m/r/g. No  LE edema. Telemetry: SR, no arrhythmias  Respiratory: CTA bilaterally, no w/r/r. Normal respiratory effort. Abdomen: soft, ntnd Skin: no rash or induration seen on limited exam Musculoskeletal: grossly normal tone BUE/BLE Neurologic: grossly non-focal.          Labs on Admission:  Basic Metabolic Panel:  Recent Labs Lab 10/05/15 0921  NA 142  K 4.0  CL 107  CO2 27  GLUCOSE 106*  BUN 15  CREATININE 0.76  CALCIUM 8.7*   Liver Function Tests:  Recent Labs Lab 10/05/15 0921  AST 22  ALT 17  ALKPHOS 81  BILITOT 0.6  PROT 7.5  ALBUMIN 3.5   No results for input(s): LIPASE, AMYLASE in the last 168 hours. No results for  input(s): AMMONIA in the last 168 hours. CBC:  Recent Labs Lab 10/05/15 0921  WBC 5.5  HGB 12.4  HCT 38.8  MCV 93.9  PLT 233   Cardiac Enzymes: No results for input(s): CKTOTAL, CKMB, CKMBINDEX, TROPONINI in the last 168 hours.  BNP (last 3 results) No results for input(s): BNP in the last 8760 hours.  ProBNP (last 3 results) No results for input(s): PROBNP in the last 8760 hours.  CBG: No results for input(s): GLUCAP in the last 168 hours.  Radiological Exams on Admission: Dg Chest Port 1 View  10/05/2015  CLINICAL DATA:  Altered mental status EXAM: PORTABLE CHEST 1 VIEW COMPARISON:  September 03, 2015 FINDINGS: There is opacification apparent pleural thickening along the inferolateral right hemithorax, new from 1 month prior. Lungs elsewhere are clear. Heart is borderline prominent with pulmonary vascularity within normal limits. No pneumothorax. No adenopathy. There is degenerative change in both shoulders. IMPRESSION: Suspect loculated effusion or possibly hematoma inferolateral right hemithorax. Atypical pneumonia in this area is possible but felt to be less likely. No fracture is evident in this area. No pneumothorax. Left lung is clear. Heart borderline prominent. Follow-up study in 7-10 days advised to further evaluate the opacity in the lateral right hemithorax. Electronically Signed   By: Bretta BangWilliam  Woodruff III M.D.   On: 10/05/2015 14:09    EKG: Independently reviewed. Will order EKG>   Assessment/Plan Active Problems:   Acute encephalopathy   Compression fracture of T12 vertebra (HCC)   Fall   UTI (lower urinary tract infection)  1-Acute encephalopathy;  Has baseline dementia with behavior problems. More agitated suspect related to infection.  CT head due to history of fall.  Mccoy for UTI.  Will continue with ativan and zyprexa.  Sitter at bedside.   2-UTI; UA with too numerous to count WBC.  Follow urine culture.  Continue with ceftriaxone.   3-Lateral  right hemithorax with possible loculated effusion Vs Hematoma;  Needs repeat Chest x ray in 1 week.  Monitor hb, monitor for fever.  Patient afebrile.  Continue for now with azithromycin and Ceftriaxone.   4-frequent fall;  Fall precaution.  Will need PT.  Check thoracic and lumbar spine x ray.  Ct head.   Code Status: full code.  DVT Prophylaxis:scd, due to possible hematoma, hemithorax.  Family Communication: care discussed with son , Rosaland LaoBehar George.  Disposition Plan: admit for treatment of UTI, Encephalopathy  Time spent: 75 minutes.   Hartley Barefootegalado, Belkys A [facility] Pager [phone]

## 2015-10-05 NOTE — ED Notes (Addendum)
Per EMS, pt has alzheimer's disease at baseline. Per pt family, increased confusion, foul smelling urine for last several days. Pt combative en route. Pt alert. Per EMS, pt fell x2 weeks ago and reports pt has lumbar fracture.

## 2015-10-05 NOTE — ED Notes (Signed)
Hospitalist at bedside 

## 2015-10-06 ENCOUNTER — Inpatient Hospital Stay (HOSPITAL_COMMUNITY): Payer: Medicare Other

## 2015-10-06 LAB — COMPREHENSIVE METABOLIC PANEL
ALBUMIN: 3.2 g/dL — AB (ref 3.5–5.0)
ALK PHOS: 79 U/L (ref 38–126)
ALT: 19 U/L (ref 14–54)
ANION GAP: 10 (ref 5–15)
AST: 24 U/L (ref 15–41)
BILIRUBIN TOTAL: 0.8 mg/dL (ref 0.3–1.2)
BUN: 12 mg/dL (ref 6–20)
CALCIUM: 8.7 mg/dL — AB (ref 8.9–10.3)
CO2: 25 mmol/L (ref 22–32)
Chloride: 108 mmol/L (ref 101–111)
Creatinine, Ser: 0.6 mg/dL (ref 0.44–1.00)
GFR calc Af Amer: >60 mL/min (ref >60)
GLUCOSE: 99 mg/dL (ref 65–99)
POTASSIUM: 4 mmol/L (ref 3.5–5.1)
Sodium: 143 mmol/L (ref 135–145)
TOTAL PROTEIN: 7 g/dL (ref 6.5–8.1)

## 2015-10-06 LAB — URINE CULTURE

## 2015-10-06 LAB — CBC
HEMATOCRIT: 38.1 % (ref 36.0–46.0)
Hemoglobin: 12.4 g/dL (ref 12.0–15.0)
MCH: 30.4 pg (ref 26.0–34.0)
MCHC: 32.5 g/dL (ref 30.0–36.0)
MCV: 93.4 fL (ref 78.0–100.0)
Platelets: 241 10*3/uL (ref 150–400)
RBC: 4.08 MIL/uL (ref 3.87–5.11)
RDW: 12.8 % (ref 11.5–15.5)
WBC: 5.4 10*3/uL (ref 4.0–10.5)

## 2015-10-06 MED ORDER — IOHEXOL 300 MG/ML  SOLN
75.0000 mL | Freq: Once | INTRAMUSCULAR | Status: AC | PRN
  Administered 2015-10-06: 75 mL via INTRAVENOUS

## 2015-10-06 NOTE — Care Management (Signed)
Spoke with the son Tessie Ekeaul Conti who stated that [facility] and did very well there previously. He thinks patient would do well at a facility. Explained to son about criteria. Son stated that the patient would not have anyone at home to stay with her at discharge. More to follow.

## 2015-10-06 NOTE — Care Management Note (Signed)
Case Management Note  Patient Details  Name: Micheline MazeMarie Clay MRN: [id number] Date of Birth: [birth year]  Subjective/Objective:        Spoke with son Rosaland LaoGeorge Danish who is POA .  Discussed various discharge options including Home with HH and 24 hour supervision. Memory Care Unit, Long Term placement.  Son stated that the patient had applied for Medicaid and that she has letter but not the card. Informed Greggory StallionGeorge that he must have the card to have the benefit. Per CSW.  It has been 3 weeks since patient received letter so it should be a short time until her card a is received.   Greggory StallionGeorge stated that his family had tried taking care of patient at home and had exhausted resources with paying for sitters. Son stated that the family had taken turns [facility] the [facility] so that she would be allowed to stay there. [facility]    Greggory StallionGeorge stated that the family can no longer take care of the patient and that there would now be no one available to stay with her when discharged. More to follow.   Action/Plan: Would like LTP ,  Family may have no other option but to take home.   Expected Discharge Date:                  Expected Discharge Plan:  Assisted Living / Rest Home  In-House Referral:     Discharge planning Services  CM Consult  Post Acute Care Choice:    Choice offered to:     DME Arranged:    DME Agency:     HH Arranged:    HH Agency:     Status of Service:  In process, will continue to follow  Medicare Important Message Given:    Date Medicare IM Given:    Medicare IM give by:    Date Additional Medicare IM Given:    Additional Medicare Important Message give by:     If discussed at Long Length of Stay Meetings, dates discussed:    Additional Comments:  Adonis HugueninBerkhead, Jerre Vandrunen L, RN 10/06/2015, 3:12 PM

## 2015-10-06 NOTE — Progress Notes (Signed)
[facility] PROGRESS NOTE  Tracy Mccoy VHQ:[id number] DOB: 19-Feb-1923 DOA: 10/05/2015 PCP: No primary care provider on file.  Assessment/Plan: 1-Acute encephalopathy;  Has baseline dementia with behavior problems. More agitated suspect related to infection.  CT head negative Treating  for UTI.  Will continue with ativan and zyprexa.  Sitter at bedside.  Haldol PRN.   2-UTI; UA with too numerous to count WBC.  Follow urine culture.  Continue with ceftriaxone.   3-Lateral right hemithorax with possible loculated effusion Vs Hematoma;  Needs repeat Chest x ray in 1 week.  Monitor hb, monitor for fever.  Patient afebrile.  Continue for now with azithromycin and Ceftriaxone.  Will check CT chest with contrast to better evaluate.   4-frequent fall;  Fall precaution.  Will need PT.  thoracic and lumbar spine x ray. progression loss of high of compression fracture T 8. Will discussed with neurosurgery  Ct head diffuse atrophy   Code Status: Full Code.  Family Communication: care discussed with patient's son  Disposition Plan: remain inpatient    Consultants:  none  Procedures:  none  Antibiotics:  Ceftriaxone  Azithromycin.   HPI/Subjective: Less aggiated than yesterday, sitter at bedside. Picking with her hands.  This is worse than patient baseline.   Objective: Filed Vitals:   10/05/15 2104 10/06/15 0500  BP: 166/109 154/58  Pulse: 86 85  Temp: 97.4 F (36.3 C) 97.5 F (36.4 C)  Resp: 20 18    Intake/Output Summary (Last 24 hours) at 10/06/15 0748 Last data filed at 10/06/15 0341  Gross per 24 hour  Intake 735.84 ml  Output      0 ml  Net 735.84 ml   Filed Weights   10/05/15 0849 10/05/15 1632 10/06/15 0400  Weight: 63.504 kg (140 lb) 62.823 kg (138 lb 8 oz) 62.7 kg (138 lb 3.7 oz)    Exam:   General:  Alert in no acute distress.   Cardiovascular: S 1, S 2 RRR  Respiratory: CTA  Abdomen: BS present,. Soft   nt  Musculoskeletal: no edema  Data Reviewed: Basic Metabolic Panel:  Recent Labs Lab 10/05/15 0921 10/06/15 0612  NA 142 143  K 4.0 4.0  CL 107 108  CO2 27 25  GLUCOSE 106* 99  BUN 15 12  CREATININE 0.76 0.60  CALCIUM 8.7* 8.7*   Liver Function Tests:  Recent Labs Lab 10/05/15 0921 10/06/15 0612  AST 22 24  ALT 17 19  ALKPHOS 81 79  BILITOT 0.6 0.8  PROT 7.5 7.0  ALBUMIN 3.5 3.2*   No results for input(s): LIPASE, AMYLASE in the last 168 hours. No results for input(s): AMMONIA in the last 168 hours. CBC:  Recent Labs Lab 10/05/15 0921 10/06/15 0612  WBC 5.5 5.4  HGB 12.4 12.4  HCT 38.8 38.1  MCV 93.9 93.4  PLT 233 241   Cardiac Enzymes: No results for input(s): CKTOTAL, CKMB, CKMBINDEX, TROPONINI in the last 168 hours. BNP (last 3 results) No results for input(s): BNP in the last 8760 hours.  ProBNP (last 3 results) No results for input(s): PROBNP in the last 8760 hours.  CBG: No results for input(s): GLUCAP in the last 168 hours.  No results found for this or any previous visit (from the past 240 hour(s)).   Studies: Dg Thoracic Spine 2 View  10/05/2015  CLINICAL DATA:  Back pain after falling 2 weeks ago. Dementia with combativeness. EXAM: THORACIC SPINE 2 VIEWS COMPARISON:  Portable chest 10/05/2015. Thoracic spine radiographs 08/23/2015. FINDINGS:  The bones are diffusely demineralized. There are 12 rib-bearing thoracic type vertebral bodies. The T8 compression deformity has progressed over the last 6 weeks, now with approximately 75% loss of vertebral body height. There is no widening of the interpedicular distance, although there may be a small amount of paraspinal hemorrhage. The inferior endplate compression deformity at T11 is unchanged. No other fractures are seen. The alignment is stable. IMPRESSION: Progressive loss of height at T8 compression deformity since prior radiographs of 6 weeks ago. Stable T11 compression deformity. Electronically  Signed   By: Carey Bullocks M.D.   On: 10/05/2015 17:28   Dg Lumbar Spine 2-3 Views  10/05/2015  CLINICAL DATA:  Back pain after falling 2 weeks ago. Dementia with combativeness. EXAM: LUMBAR SPINE - 2-3 VIEW COMPARISON:  Thoracic radiographs today. Lumbar spine radiographs 09/01/2015. FINDINGS: The bones are demineralized. There are 5 lumbar type vertebral bodies. The alignment is stable with a mild convex right scoliosis and grade 1 degenerative anterolisthesis at L4-5. No evidence of acute fracture or pars defect. Facet degenerative changes are present inferiorly. The disc spaces are mildly narrowed at L3-4 and L4-5. IMPRESSION: No acute lumbar spine findings. Electronically Signed   By: Carey Bullocks M.D.   On: 10/05/2015 17:29   Ct Head Wo Contrast  10/05/2015  CLINICAL DATA:  Increasing confusion following fall 2 weeks prior EXAM: CT HEAD WITHOUT CONTRAST TECHNIQUE: Contiguous axial images were obtained from the base of the skull through the vertex without intravenous contrast. COMPARISON:  Head CT August 27, 2015; brain MRI August 28, 2015 FINDINGS: Mild diffuse atrophy is stable. There is no intracranial mass hemorrhage, extra-axial fluid collection, or midline shift. There is slight small vessel disease in the centra semiovale bilaterally, stable. Elsewhere gray-white compartments appear normal. No acute infarct evident. The bony calvarium appears intact. The mastoid air cells are clear. No intraorbital lesions are apparent. IMPRESSION: Mild diffuse atrophy with mild periventricular small vessel disease. No intracranial mass, hemorrhage, or acute appearing infarct. Electronically Signed   By: Bretta Bang III M.D.   On: 10/05/2015 17:10   Dg Chest Port 1 View  10/05/2015  CLINICAL DATA:  Altered mental status EXAM: PORTABLE CHEST 1 VIEW COMPARISON:  September 03, 2015 FINDINGS: There is opacification apparent pleural thickening along the inferolateral right hemithorax, new from 1 month  prior. Lungs elsewhere are clear. Heart is borderline prominent with pulmonary vascularity within normal limits. No pneumothorax. No adenopathy. There is degenerative change in both shoulders. IMPRESSION: Suspect loculated effusion or possibly hematoma inferolateral right hemithorax. Atypical pneumonia in this area is possible but felt to be less likely. No fracture is evident in this area. No pneumothorax. Left lung is clear. Heart borderline prominent. Follow-up study in 7-10 days advised to further evaluate the opacity in the lateral right hemithorax. Electronically Signed   By: Bretta Bang III M.D.   On: 10/05/2015 14:09   Dg Hips Bilat With Pelvis 3-4 Views  10/05/2015  CLINICAL DATA:  Fall. EXAM: DG HIP (WITH OR WITHOUT PELVIS) 3-4V BILAT COMPARISON:  09/03/2015 bilateral hip and pelvic radiographs. FINDINGS: Diffuse osteopenia. No pelvic diastasis. No fracture or dislocation in the hips. No pelvic fracture. No suspicious focal osseous lesions. Mild osteoarthritis in both hip joints. Degenerative changes in the visualized lower lumbar spine. IMPRESSION: No fracture.  No hip dislocation.  Diffuse osteopenia. Electronically Signed   By: Delbert Phenix M.D.   On: 10/05/2015 17:26    Scheduled Meds: . aspirin EC  81 mg Oral  Daily  . azithromycin  500 mg Intravenous Q24H  . cefTRIAXone (ROCEPHIN)  IV  1 g Intravenous Q24H  . docusate sodium  100 mg Oral BID  . donepezil  5 mg Oral QHS  . multivitamin with minerals  1 tablet Oral Daily  . OLANZapine  2.5 mg Oral QHS  . senna  1 tablet Oral BID  . sodium chloride flush  3 mL Intravenous Q12H   Continuous Infusions: . sodium chloride 50 mL/hr at 10/05/15 1758    Active Problems:   Acute encephalopathy   Compression fracture of T12 vertebra (HCC)   Fall   UTI (lower urinary tract infection)    Time spent: 35 minutes.     Hartley Barefoot A  [facility] Pager [phone]. If 7PM-7AM, please contact night-coverage at  [email or web address], password [facility] 10/06/2015, 7:48 AM  LOS: 1 day

## 2015-10-07 DIAGNOSIS — W19XXXD Unspecified fall, subsequent encounter: Secondary | ICD-10-CM

## 2015-10-07 LAB — CBC
HCT: 36 % (ref 36.0–46.0)
Hemoglobin: 11.6 g/dL — ABNORMAL LOW (ref 12.0–15.0)
MCH: 30.3 pg (ref 26.0–34.0)
MCHC: 32.2 g/dL (ref 30.0–36.0)
MCV: 94 fL (ref 78.0–100.0)
PLATELETS: 229 10*3/uL (ref 150–400)
RBC: 3.83 MIL/uL — AB (ref 3.87–5.11)
RDW: 13.1 % (ref 11.5–15.5)
WBC: 5.5 10*3/uL (ref 4.0–10.5)

## 2015-10-07 LAB — BASIC METABOLIC PANEL
ANION GAP: 6 (ref 5–15)
BUN: 14 mg/dL (ref 6–20)
CALCIUM: 7.9 mg/dL — AB (ref 8.9–10.3)
CO2: 25 mmol/L (ref 22–32)
Chloride: 109 mmol/L (ref 101–111)
Creatinine, Ser: 0.62 mg/dL (ref 0.44–1.00)
GLUCOSE: 102 mg/dL — AB (ref 65–99)
POTASSIUM: 3.6 mmol/L (ref 3.5–5.1)
SODIUM: 140 mmol/L (ref 135–145)

## 2015-10-07 MED ORDER — HALOPERIDOL 2 MG PO TABS
1.0000 mg | ORAL_TABLET | Freq: Two times a day (BID) | ORAL | Status: DC | PRN
  Administered 2015-10-08 – 2015-10-11 (×4): 1 mg via ORAL
  Filled 2015-10-07 (×5): qty 1

## 2015-10-07 MED ORDER — HALOPERIDOL LACTATE 5 MG/ML IJ SOLN: 1.0000 mg | Freq: Two times a day (BID) | INTRAMUSCULAR | Status: DC | PRN

## 2015-10-07 MED ORDER — TUBERCULIN PPD 5 UNIT/0.1ML ID SOLN
5.0000 [IU] | Freq: Once | INTRADERMAL | Status: DC
  Filled 2015-10-07: qty 0.1

## 2015-10-07 NOTE — Care Management (Signed)
Case discussed with CSW Frances MaywoodHannah, [employer] councelor and attending phycisian, Understanding that family will need to make arrangements to take that patient home. Finance has been in contact with POA Greggory StallionGeorge to discuss medicare application. No applicaton for medicaid was ever made. Finance is assisting with this.  CSW Dahlia ClientHannah has reviewed case.(see CSW notes) . More to follow.

## 2015-10-07 NOTE — Progress Notes (Addendum)
2:27 PM Spoke with [place] DSS worker Annabelle HarmanDana [phone] who is working with family and clarifying more information regarding patient's insurance and pending medicaid. PT has limited medicaid as she is over the limit for ALF. She would qualify for specific medicaid in special care unit if Dementia is primary dx.  [facility] is coming to see patient at hospital at 3:30 on Friday to see if patient would qualify for SCU at facility.  LCSW will complete FL2. TB screen test and passar. Patient is planned for DC on Monday per treatment team.  LCSW following acutely along with CM regarding disposition. Chart reviewed and there have been numerous contacts made with family to understand barriers to keeping patient in the home vs a locked memory care unit.  At the end of Feb. 2017 patient discharged from SNF: [facility] after 18 days of skilled care. Family reported to CM that they had applied for Medicaid,  However this was proven false.  There is an abundant amount of family dispersed throughout the [place] with POA living in another state. Patient recently relocated to [place] to be close to her retired son in [place]. Family reports they can no longer take care of patient at home and provide 24hr/7 day supervision.  Patient is combative and aggressive at times secondary to her dx of dementia with behavioral disturbances.  Family has emptied out patient's trust and has no other monies to support private duty sitters per family.  Per MD note:  The patient actually lives alone with very frequent visits from family. This is up to 4 times a day. They prepare her meals. Other than that she seems independent with ADLs by description of her son. She does not go out of the home cannot manage her finances. She does not use a cane or walker.  Disposition: Per notes, it appears patient would benefit from ALF memory care/locked facility unit. Family can pursue this outside of the hospital and not a reason to  keep patient in hospital if she is medically stable. LCSW can begin process for family and request TB Test. It may beneficial for PT consult to understand if patient remains independent as described in notes. This was discussed with CM Rick.  LCSW can also begin passar through [facility].   Will continue to follow as needed.  At this time patient is not meeting skilled need for rehab/no recommendations outside of family asking.   Deretha EmoryHannah Satish Hammers LCSW, MSW Clinical Social Work: [facility] [employer] [phone]

## 2015-10-07 NOTE — Progress Notes (Signed)
PT Cancellation Note  Patient Details Name: Tracy MazeMarie Mccoy MRN: [id number] DOB: [birth year]   Cancelled Treatment:    Reason Eval/Treat Not Completed: PT screened, no needs identified, will sign off. Chart reviewed. Pt has moderate-severe dementia at baseline, with well documented limitations in ability to participate with skilled PT in ways that are contributory to her longer term prognosis and function. Pt has recently DC from short term rehab wherein she required assistance from family to actively participate. Pt will benefit from continued participation in ADL with caregivers going forward and will require 24/7 supervision for safety.    3:07 PM, 10/07/2015 Rosamaria LintsAllan C Mariaeduarda Defranco, PT, DPT PRN Physical Therapist at [facility] License # [id number] [phone] (wireless)  [phone] (mobile)

## 2015-10-07 NOTE — Progress Notes (Signed)
**Note De-identified  Obfuscation** EKG placed in chart

## 2015-10-07 NOTE — Progress Notes (Signed)
[facility] PROGRESS NOTE  Tracy Mccoy [id number]:[id number] DOB: 24-Jan-1923 DOA: 10/05/2015 PCP: No primary care provider on file.  Assessment/Plan: 1-Acute encephalopathy;  Has baseline dementia with behavior problems. More agitated suspect related to infection.  CT head negative Treating  for UTI. Urine culture growing many bacteria, will send culture again.  Will continue with ativan and zyprexa.  Sitter at bedside.  Haldol PRN. Will change haldol to oral.   2-UTI; UA with too numerous to count WBC.  urine culture with multiples bacteria, will send culture again. .  Continue with ceftriaxone.   3-Lateral right hemithorax with possible loculated effusion Vs Hematoma;  Needs repeat Chest x ray in 1 week.  Monitor hb, monitor for fever.  Patient afebrile.  CT chest negative. Will discontinue azithromycin   4-frequent fall;  Fall precaution.  PT consulted.   thoracic and lumbar spine x ray. progression loss of high of compression fracture T 8. Discussed with neuro-sx, medical management for fracture. Patient can use Brace if she has significant pain.  Ct head diffuse atrophy   Code Status: Full Code.  Family Communication: care discussed with patient's son  Disposition Plan: remain inpatient    Consultants:  none  Procedures:  none  Antibiotics:  Ceftriaxone  Azithromycin.   HPI/Subjective: She is more calm today, received multiple doses of IV haldol yesterday.    Objective: Filed Vitals:   10/06/15 1321 10/07/15 0654  BP: 164/84 153/70  Pulse: 84 79  Temp: 97.9 F (36.6 C) 98.2 F (36.8 C)  Resp: 18 20    Intake/Output Summary (Last 24 hours) at 10/07/15 1339 Last data filed at 10/07/15 1103  Gross per 24 hour  Intake 1148.83 ml  Output      0 ml  Net 1148.83 ml   Filed Weights   10/05/15 1632 10/06/15 0400 10/07/15 0654  Weight: 62.823 kg (138 lb 8 oz) 62.7 kg (138 lb 3.7 oz) 63.5 kg (139 lb 15.9 oz)    Exam:   General:  Alert  in no acute distress.   Cardiovascular: S 1, S 2 RRR  Respiratory: CTA  Abdomen: BS present,. Soft  nt  Musculoskeletal: no edema  Data Reviewed: Basic Metabolic Panel:  Recent Labs Lab 10/05/15 0921 10/06/15 0612 10/07/15 0554  NA 142 143 140  K 4.0 4.0 3.6  CL 107 108 109  CO2 27 25 25   GLUCOSE 106* 99 102*  BUN 15 12 14   CREATININE 0.76 0.60 0.62  CALCIUM 8.7* 8.7* 7.9*   Liver Function Tests:  Recent Labs Lab 10/05/15 0921 10/06/15 0612  AST 22 24  ALT 17 19  ALKPHOS 81 79  BILITOT 0.6 0.8  PROT 7.5 7.0  ALBUMIN 3.5 3.2*   No results for input(s): LIPASE, AMYLASE in the last 168 hours. No results for input(s): AMMONIA in the last 168 hours. CBC:  Recent Labs Lab 10/05/15 0921 10/06/15 0612 10/07/15 0554  WBC 5.5 5.4 5.5  HGB 12.4 12.4 11.6*  HCT 38.8 38.1 36.0  MCV 93.9 93.4 94.0  PLT 233 241 229   Cardiac Enzymes: No results for input(s): CKTOTAL, CKMB, CKMBINDEX, TROPONINI in the last 168 hours. BNP (last 3 results) No results for input(s): BNP in the last 8760 hours.  ProBNP (last 3 results) No results for input(s): PROBNP in the last 8760 hours.  CBG: No results for input(s): GLUCAP in the last 168 hours.  Recent Results (from the past 240 hour(s))  Urine culture     Status: None  Collection Time: 10/05/15 10:44 AM  Result Value Ref Range Status   Specimen Description URINE, CLEAN CATCH  Final   Special Requests IMMUNE:COMPROMISED  Final   Culture   Final    MULTIPLE SPECIES PRESENT, SUGGEST RECOLLECTION Performed at [facility]    Report Status 10/06/2015 FINAL  Final     Studies: Dg Thoracic Spine 2 View  10/05/2015  CLINICAL DATA:  Back pain after falling 2 weeks ago. Dementia with combativeness. EXAM: THORACIC SPINE 2 VIEWS COMPARISON:  Portable chest 10/05/2015. Thoracic spine radiographs 08/23/2015. FINDINGS: The bones are diffusely demineralized. There are 12 rib-bearing thoracic type vertebral bodies. The T8  compression deformity has progressed over the last 6 weeks, now with approximately 75% loss of vertebral body height. There is no widening of the interpedicular distance, although there may be a small amount of paraspinal hemorrhage. The inferior endplate compression deformity at T11 is unchanged. No other fractures are seen. The alignment is stable. IMPRESSION: Progressive loss of height at T8 compression deformity since prior radiographs of 6 weeks ago. Stable T11 compression deformity. Electronically Signed   By: Carey Bullocks M.D.   On: 10/05/2015 17:28   Dg Lumbar Spine 2-3 Views  10/05/2015  CLINICAL DATA:  Back pain after falling 2 weeks ago. Dementia with combativeness. EXAM: LUMBAR SPINE - 2-3 VIEW COMPARISON:  Thoracic radiographs today. Lumbar spine radiographs 09/01/2015. FINDINGS: The bones are demineralized. There are 5 lumbar type vertebral bodies. The alignment is stable with a mild convex right scoliosis and grade 1 degenerative anterolisthesis at L4-5. No evidence of acute fracture or pars defect. Facet degenerative changes are present inferiorly. The disc spaces are mildly narrowed at L3-4 and L4-5. IMPRESSION: No acute lumbar spine findings. Electronically Signed   By: Carey Bullocks M.D.   On: 10/05/2015 17:29   Ct Head Wo Contrast  10/05/2015  CLINICAL DATA:  Increasing confusion following fall 2 weeks prior EXAM: CT HEAD WITHOUT CONTRAST TECHNIQUE: Contiguous axial images were obtained from the base of the skull through the vertex without intravenous contrast. COMPARISON:  Head CT August 27, 2015; brain MRI August 28, 2015 FINDINGS: Mild diffuse atrophy is stable. There is no intracranial mass hemorrhage, extra-axial fluid collection, or midline shift. There is slight small vessel disease in the centra semiovale bilaterally, stable. Elsewhere gray-white compartments appear normal. No acute infarct evident. The bony calvarium appears intact. The mastoid air cells are clear. No  intraorbital lesions are apparent. IMPRESSION: Mild diffuse atrophy with mild periventricular small vessel disease. No intracranial mass, hemorrhage, or acute appearing infarct. Electronically Signed   By: Bretta Bang III M.D.   On: 10/05/2015 17:10   Ct Chest W Contrast  10/06/2015  CLINICAL DATA:  Possible pleural effusion EXAM: CT CHEST WITH CONTRAST TECHNIQUE: Multidetector CT imaging of the chest was performed during intravenous contrast administration. CONTRAST:  75mL OMNIPAQUE IOHEXOL 300 MG/ML  SOLN COMPARISON:  10/05/2015 FINDINGS: The lungs are well aerated bilaterally. A minimal amount of dependent atelectasis is noted on the left. Some subpleural fat is noted bilaterally. The changes seen on the recent chest x-ray laterally on the right are related to prior rib deformity and mild pleural thickening. No loculated fluid collection is seen. No focal infiltrate is noted. The thoracic inlet is within normal limits. The thoracic aorta show some calcifications without aneurysmal dilatation or dissection. The pulmonary artery as visualized is within normal limits. No hilar or mediastinal adenopathy is seen. Large right renal cyst is noted arising from the upper  pole measuring 10 cm in greatest dimension. Small cyst is noted in the upper pole of the left kidney. Some cystic changes noted near the head of the pancreas which is incompletely evaluated on this examination. This measures approximately 2 cm. IMPRESSION: No effusion or infiltrate is noted to correspond with the abnormality seen on the recent chest x-ray. It is related to a combination of pleural thickening and patient rotation on the prior exam. Minimal dependent atelectasis in the left base. Renal cystic change. Cystic change near the head of the pancreas which is incompletely evaluated on this exam. The need for further workup can be determined on a clinical basis. Electronically Signed   By: Alcide Clever M.D.   On: 10/06/2015 19:16   Dg  Chest Port 1 View  10/05/2015  CLINICAL DATA:  Altered mental status EXAM: PORTABLE CHEST 1 VIEW COMPARISON:  September 03, 2015 FINDINGS: There is opacification apparent pleural thickening along the inferolateral right hemithorax, new from 1 month prior. Lungs elsewhere are clear. Heart is borderline prominent with pulmonary vascularity within normal limits. No pneumothorax. No adenopathy. There is degenerative change in both shoulders. IMPRESSION: Suspect loculated effusion or possibly hematoma inferolateral right hemithorax. Atypical pneumonia in this area is possible but felt to be less likely. No fracture is evident in this area. No pneumothorax. Left lung is clear. Heart borderline prominent. Follow-up study in 7-10 days advised to further evaluate the opacity in the lateral right hemithorax. Electronically Signed   By: Bretta Bang III M.D.   On: 10/05/2015 14:09   Dg Hips Bilat With Pelvis 3-4 Views  10/05/2015  CLINICAL DATA:  Fall. EXAM: DG HIP (WITH OR WITHOUT PELVIS) 3-4V BILAT COMPARISON:  09/03/2015 bilateral hip and pelvic radiographs. FINDINGS: Diffuse osteopenia. No pelvic diastasis. No fracture or dislocation in the hips. No pelvic fracture. No suspicious focal osseous lesions. Mild osteoarthritis in both hip joints. Degenerative changes in the visualized lower lumbar spine. IMPRESSION: No fracture.  No hip dislocation.  Diffuse osteopenia. Electronically Signed   By: Delbert Phenix M.D.   On: 10/05/2015 17:26    Scheduled Meds: . aspirin EC  81 mg Oral Daily  . azithromycin  500 mg Intravenous Q24H  . cefTRIAXone (ROCEPHIN)  IV  1 g Intravenous Q24H  . docusate sodium  100 mg Oral BID  . donepezil  5 mg Oral QHS  . multivitamin with minerals  1 tablet Oral Daily  . OLANZapine  2.5 mg Oral QHS  . senna  1 tablet Oral BID  . sodium chloride flush  3 mL Intravenous Q12H   Continuous Infusions: . sodium chloride Stopped (10/06/15 1818)    Active Problems:   Acute  encephalopathy   Compression fracture of T12 vertebra (HCC)   Fall   UTI (lower urinary tract infection)    Time spent: 35 minutes.     Hartley Barefoot A  [facility] Pager [phone]. If 7PM-7AM, please contact night-coverage at [email or web address], password [facility] 10/07/2015, 1:39 PM  LOS: 2 days

## 2015-10-07 NOTE — Clinical Social Work Placement (Signed)
   CLINICAL SOCIAL WORK PLACEMENT  NOTE  Date:  10/07/2015  Patient Details  Name: Micheline MazeMarie Percle MRN: [id number] Date of Birth: 1923/04/23  Clinical Social Work is seeking post-discharge placement for this patient at the Assisted Living Facility (Special Care Unit) level of care (*CSW will initial, date and re-position this form in  chart as items are completed):  Yes   Patient/family provided with [place] Clinical Social Work Department's list of facilities offering this level of care within the geographic area requested by the patient (or if unable, by the patient's family).  Yes   Patient/family informed of their freedom to choose among providers that offer the needed level of care, that participate in Medicare, Medicaid or managed care program needed by the patient, have an available bed and are willing to accept the patient.  Yes   Patient/family informed of [facility]'s ownership interest in [facility] and [facility], as well as of the fact that they are under no obligation to receive care at these facilities.  PASRR submitted to EDS on 10/07/15     PASRR number received on 10/07/15     Existing PASRR number confirmed on       FL2 transmitted to all facilities in geographic area requested by pt/family on 10/07/15     FL2 transmitted to all facilities within larger geographic area on       Patient informed that his/her managed care company has contracts with or will negotiate with certain facilities, including the following:        Yes   Patient/family informed of bed offers received.  Patient chooses bed at     On wait list for [facility]  Physician recommends and patient chooses bed at      Patient to be transferred to   on  .    Patient to be transferred to facility by       Patient family notified on   of transfer.  Name of family member notified:        PHYSICIAN Please sign FL2     Additional Comment:  Patient is being looked at and reviewed by  [facility] for memory care.   _______________________________________________ Raye Sorrowoble, Donnette Macmullen N, LCSW 10/07/2015, 3:10 PM

## 2015-10-07 NOTE — NC FL2 (Addendum)
[place] MEDICAID FL2 LEVEL OF CARE SCREENING TOOL     IDENTIFICATION  Patient Name: Tracy MazeMarie Wicke Birthdate: August 20, 1922 Sex: female Admission Date (Current Location): 10/05/2015  [facility] [place]   Facility and Address:  [facility],  [address], [place] [id number]      Provider Number: [id number]  Attending Physician Name and Address:  Alba CoryBelkys A Regalado, MD  Relative Name and Phone Number:    Sharene ButtersGeorge POA      Current Level of Care: Hospital Recommended Level of Care: Other (Comment) (Special Care Unit) Prior Approval Number:    Date Approved/Denied:   PASRR Number:   [id number] O SS # [id number]  Discharge Plan: Other (Comment) (Special Care Unit)    Current Diagnoses: Patient Active Problem List   Diagnosis Date Noted  . UTI (lower urinary tract infection) 10/05/2015  . Dementia 08/28/2015  . Compression fracture of T12 vertebra (HCC) 08/28/2015  . Fall 08/28/2015  . Dysarthria 08/28/2015  . CAP (community acquired pneumonia) 08/27/2015  . Acute encephalopathy 08/27/2015    Orientation RESPIRATION BLADDER Height & Weight     Self  Normal Incontinent Weight: 139 lb 15.9 oz (63.5 kg) Height:  5\' 7"  (170.2 cm)  BEHAVIORAL SYMPTOMS/MOOD NEUROLOGICAL BOWEL NUTRITION STATUS  Other (Comment) (confused, Dementia primary, combative at times. Needs locked unit)   Incontinent Diet (regular diet)  AMBULATORY STATUS COMMUNICATION OF NEEDS Skin   Supervision Verbally Normal                       Personal Care Assistance Level of Assistance  Bathing, Feeding, Dressing Bathing Assistance: Independent Feeding assistance: Independent Dressing Assistance: Independent     Functional Limitations Info  Sight, Hearing, Speech Sight Info: Adequate Hearing Info: Adequate Speech Info: Adequate    SPECIAL CARE FACTORS FREQUENCY                       Contractures Contractures Info: Not present    Additional Factors  Info  Code Status, Allergies, Psychotropic, Insulin Sliding Scale, Isolation Precautions Code Status Info: Full Code Allergies Info: NKA Psychotropic Info: Zyprexa Insulin Sliding Scale Info: none Isolation Precautions Info: none     Current Medications (10/07/2015):  This is the current hospital active medication list Current Facility-Administered Medications  Medication Dose Route Frequency Provider Last Rate Last Dose  . 0.9 %  sodium chloride infusion   Intravenous Continuous Belkys A Regalado, MD   Stopped at 10/06/15 1818  . acetaminophen (TYLENOL) tablet 650 mg  650 mg Oral Q6H PRN Belkys A Regalado, MD       Or  . acetaminophen (TYLENOL) suppository 650 mg  650 mg Rectal Q6H PRN Belkys A Regalado, MD      . aspirin EC tablet 81 mg  81 mg Oral Daily Belkys A Regalado, MD   81 mg at 10/07/15 1053  . azithromycin (ZITHROMAX) 500 mg in dextrose 5 % 250 mL IVPB  500 mg Intravenous Q24H Donnetta HutchingBrian Cook, MD 250 mL/hr at 10/06/15 1518 500 mg at 10/06/15 1518  . bisacodyl (DULCOLAX) EC tablet 5 mg  5 mg Oral Daily PRN Alba CoryBelkys A Regalado, MD   5 mg at 10/05/15 2119  . cefTRIAXone (ROCEPHIN) 1 g in dextrose 5 % 50 mL IVPB  1 g Intravenous Q24H Belkys A Regalado, MD   1 g at 10/07/15 1059  . docusate sodium (COLACE) capsule 100 mg  100 mg Oral BID Belkys A Regalado,  MD   100 mg at 10/07/15 1053  . donepezil (ARICEPT) tablet 5 mg  5 mg Oral QHS Belkys A Regalado, MD   5 mg at 10/06/15 2120  . haloperidol (HALDOL) tablet 1 mg  1 mg Oral BID PRN Belkys A Regalado, MD      . LORazepam (ATIVAN) tablet 1 mg  1 mg Oral BID PRN Belkys A Regalado, MD   1 mg at 10/05/15 1455  . multivitamin with minerals tablet 1 tablet  1 tablet Oral Daily Belkys A Regalado, MD   1 tablet at 10/07/15 1053  . OLANZapine (ZYPREXA) tablet 2.5 mg  2.5 mg Oral QHS Belkys A Regalado, MD   2.5 mg at 10/06/15 2120  . ondansetron (ZOFRAN) tablet 4 mg  4 mg Oral Q6H PRN Belkys A Regalado, MD       Or  . ondansetron (ZOFRAN) injection 4  mg  4 mg Intravenous Q6H PRN Belkys A Regalado, MD      . senna (SENOKOT) tablet 8.6 mg  1 tablet Oral BID Belkys A Regalado, MD   8.6 mg at 10/07/15 1053  . sodium chloride flush (NS) 0.9 % injection 3 mL  3 mL Intravenous Q12H Belkys A Regalado, MD   3 mL at 10/07/15 1103  . traMADol (ULTRAM) tablet 50 mg  50 mg Oral Q12H PRN Belkys A Regalado, MD   50 mg at 10/07/15 1053  . tuberculin injection 5 Units  5 Units Intradermal Once Belkys A Regalado, MD         Discharge Medications: Please see discharge summary for a list of discharge medications.  Relevant Imaging Results:  Relevant Lab Results:   Additional Information  Need for Home health: PT at facility: [facility]   Taija Mathias, Evlyn Courier, LCSW

## 2015-10-07 NOTE — Care Management Important Message (Signed)
Important Message  Patient Details  Name: Micheline MazeMarie Warehime MRN: [id number] Date of Birth: Mar 18, 1923   Medicare Important Message Given:  Yes    Adonis HugueninBerkhead, Madgeline Rayo L, RN 10/07/2015, 8:31 AM

## 2015-10-08 LAB — BASIC METABOLIC PANEL
ANION GAP: 8 (ref 5–15)
BUN: 10 mg/dL (ref 6–20)
CALCIUM: 8.3 mg/dL — AB (ref 8.9–10.3)
CO2: 25 mmol/L (ref 22–32)
CREATININE: 0.62 mg/dL (ref 0.44–1.00)
Chloride: 111 mmol/L (ref 101–111)
GLUCOSE: 100 mg/dL — AB (ref 65–99)
Potassium: 3.5 mmol/L (ref 3.5–5.1)
Sodium: 144 mmol/L (ref 135–145)

## 2015-10-08 LAB — CBC
HCT: 36.9 % (ref 36.0–46.0)
Hemoglobin: 12.2 g/dL (ref 12.0–15.0)
MCH: 30.7 pg (ref 26.0–34.0)
MCHC: 33.1 g/dL (ref 30.0–36.0)
MCV: 92.9 fL (ref 78.0–100.0)
PLATELETS: 229 10*3/uL (ref 150–400)
RBC: 3.97 MIL/uL (ref 3.87–5.11)
RDW: 13.2 % (ref 11.5–15.5)
WBC: 5.8 10*3/uL (ref 4.0–10.5)

## 2015-10-08 NOTE — Progress Notes (Signed)
[facility] PROGRESS NOTE  Tracy MazeMarie Mccoy ZOX:[id number] DOB: Oct 24, 1922 DOA: 10/05/2015 PCP: No primary care provider on file.  Assessment/Plan: 1-Acute encephalopathy;  Has baseline dementia with behavior problems. More agitated suspect related to infection.  CT head negative Treating  for UTI. Urine culture growing many bacteria, will send culture again.  Will continue with ativan and zyprexa.  Sitter at bedside.  Haldol PRN.   2-UTI; UA with too numerous to count WBC.  urine culture with multiples bacteria, will send culture again.3-31 .  Continue with ceftriaxone.   3-Lateral right hemithorax with possible loculated effusion Vs Hematoma;  Needs repeat Chest x ray in 1 week.  Monitor hb, monitor for fever.  Patient afebrile.  CT chest negative. Will discontinue azithromycin   4-frequent fall;  Fall precaution.  PT consulted.   thoracic and lumbar spine x ray. progression loss of high of compression fracture T 8. Discussed with neuro-sx, medical management for fracture. Patient can use Brace if she has significant pain.  Ct head diffuse atrophy   Code Status: Full Code.  Family Communication: care discussed with patient's son  Disposition Plan: remain inpatient    Consultants:  none  Procedures:  none  Antibiotics:  Ceftriaxone  Azithromycin.   HPI/Subjective: She is confuse, has been agitated this am per sitter at bedside. Patient has been trying to get out of bed.    Objective: Filed Vitals:   10/07/15 2228 10/08/15 0604  BP: 119/53 153/75  Pulse: 80 71  Temp: 98.1 F (36.7 C) 98 F (36.7 C)  Resp: 20 20    Intake/Output Summary (Last 24 hours) at 10/08/15 1431 Last data filed at 10/08/15 1153  Gross per 24 hour  Intake    243 ml  Output      0 ml  Net    243 ml   Filed Weights   10/05/15 1632 10/06/15 0400 10/07/15 0654  Weight: 62.823 kg (138 lb 8 oz) 62.7 kg (138 lb 3.7 oz) 63.5 kg (139 lb 15.9 oz)    Exam:   General:   Alert in no acute distress.   Cardiovascular: S 1, S 2 RRR  Respiratory: CTA  Abdomen: BS present,. Soft  nt  Musculoskeletal: no edema  Data Reviewed: Basic Metabolic Panel:  Recent Labs Lab 10/05/15 0921 10/06/15 0612 10/07/15 0554 10/08/15 0605  NA 142 143 140 144  K 4.0 4.0 3.6 3.5  CL 107 108 109 111  CO2 27 25 25 25   GLUCOSE 106* 99 102* 100*  BUN 15 12 14 10   CREATININE 0.76 0.60 0.62 0.62  CALCIUM 8.7* 8.7* 7.9* 8.3*   Liver Function Tests:  Recent Labs Lab 10/05/15 0921 10/06/15 0612  AST 22 24  ALT 17 19  ALKPHOS 81 79  BILITOT 0.6 0.8  PROT 7.5 7.0  ALBUMIN 3.5 3.2*   No results for input(s): LIPASE, AMYLASE in the last 168 hours. No results for input(s): AMMONIA in the last 168 hours. CBC:  Recent Labs Lab 10/05/15 0921 10/06/15 0612 10/07/15 0554 10/08/15 0605  WBC 5.5 5.4 5.5 5.8  HGB 12.4 12.4 11.6* 12.2  HCT 38.8 38.1 36.0 36.9  MCV 93.9 93.4 94.0 92.9  PLT 233 241 229 229   Cardiac Enzymes: No results for input(s): CKTOTAL, CKMB, CKMBINDEX, TROPONINI in the last 168 hours. BNP (last 3 results) No results for input(s): BNP in the last 8760 hours.  ProBNP (last 3 results) No results for input(s): PROBNP in the last 8760 hours.  CBG: No  results for input(s): GLUCAP in the last 168 hours.  Recent Results (from the past 240 hour(s))  Urine culture     Status: None   Collection Time: 10/05/15 10:44 AM  Result Value Ref Range Status   Specimen Description URINE, CLEAN CATCH  Final   Special Requests IMMUNE:COMPROMISED  Final   Culture   Final    MULTIPLE SPECIES PRESENT, SUGGEST RECOLLECTION Performed at [facility]    Report Status 10/06/2015 FINAL  Final     Studies: Ct Chest W Contrast  10/06/2015  CLINICAL DATA:  Possible pleural effusion EXAM: CT CHEST WITH CONTRAST TECHNIQUE: Multidetector CT imaging of the chest was performed during intravenous contrast administration. CONTRAST:  75mL OMNIPAQUE IOHEXOL 300  MG/ML  SOLN COMPARISON:  10/05/2015 FINDINGS: The lungs are well aerated bilaterally. A minimal amount of dependent atelectasis is noted on the left. Some subpleural fat is noted bilaterally. The changes seen on the recent chest x-ray laterally on the right are related to prior rib deformity and mild pleural thickening. No loculated fluid collection is seen. No focal infiltrate is noted. The thoracic inlet is within normal limits. The thoracic aorta show some calcifications without aneurysmal dilatation or dissection. The pulmonary artery as visualized is within normal limits. No hilar or mediastinal adenopathy is seen. Large right renal cyst is noted arising from the upper pole measuring 10 cm in greatest dimension. Small cyst is noted in the upper pole of the left kidney. Some cystic changes noted near the head of the pancreas which is incompletely evaluated on this examination. This measures approximately 2 cm. IMPRESSION: No effusion or infiltrate is noted to correspond with the abnormality seen on the recent chest x-ray. It is related to a combination of pleural thickening and patient rotation on the prior exam. Minimal dependent atelectasis in the left base. Renal cystic change. Cystic change near the head of the pancreas which is incompletely evaluated on this exam. The need for further workup can be determined on a clinical basis. Electronically Signed   By: Alcide Clever M.D.   On: 10/06/2015 19:16    Scheduled Meds: . aspirin EC  81 mg Oral Daily  . azithromycin  500 mg Intravenous Q24H  . cefTRIAXone (ROCEPHIN)  IV  1 g Intravenous Q24H  . docusate sodium  100 mg Oral BID  . donepezil  5 mg Oral QHS  . multivitamin with minerals  1 tablet Oral Daily  . OLANZapine  2.5 mg Oral QHS  . senna  1 tablet Oral BID  . sodium chloride flush  3 mL Intravenous Q12H  . tuberculin  5 Units Intradermal Once   Continuous Infusions: . sodium chloride 50 mL/hr at 10/07/15 1737    Active Problems:    Acute encephalopathy   Compression fracture of T12 vertebra (HCC)   Fall   UTI (lower urinary tract infection)    Time spent: 35 minutes.     Hartley Barefoot A  [facility] Pager [phone]. If 7PM-7AM, please contact night-coverage at [email or web address], password [facility] 10/08/2015, 2:31 PM  LOS: 3 days

## 2015-10-09 MED ORDER — HYDRALAZINE HCL 20 MG/ML IJ SOLN
10.0000 mg | Freq: Four times a day (QID) | INTRAMUSCULAR | Status: DC | PRN
  Administered 2015-10-09: 10 mg via INTRAVENOUS
  Filled 2015-10-09: qty 1

## 2015-10-09 MED ORDER — HALOPERIDOL LACTATE 5 MG/ML IJ SOLN
1.0000 mg | Freq: Two times a day (BID) | INTRAMUSCULAR | Status: DC | PRN
  Administered 2015-10-09: 1 mg via INTRAMUSCULAR
  Filled 2015-10-09: qty 1

## 2015-10-09 MED ORDER — LORAZEPAM 0.5 MG PO TABS
0.5000 mg | ORAL_TABLET | Freq: Two times a day (BID) | ORAL | Status: DC | PRN
Start: 2015-10-09 — End: 2015-10-11
  Administered 2015-10-09: 0.5 mg via ORAL
  Filled 2015-10-09: qty 1

## 2015-10-09 MED ORDER — HALOPERIDOL LACTATE 5 MG/ML IJ SOLN
1.0000 mg | Freq: Once | INTRAMUSCULAR | Status: AC
  Administered 2015-10-09: 1 mg via INTRAVENOUS
  Filled 2015-10-09: qty 1

## 2015-10-09 NOTE — Progress Notes (Signed)
[facility] PROGRESS NOTE  Tracy Mccoy ZOX:[id number] DOB: 07/09/1923 DOA: 10/05/2015 PCP: No primary care provider on file.  Assessment/Plan: 1-Acute encephalopathy;  Has baseline dementia with behavior problems. More agitated suspect related to infection.  CT head negative Treating  for UTI. Urine culture growing many bacteria, will send culture again.  Will continue with zyprexa.  Sitter at bedside.  Haldol PRN helps/.   2-UTI; UA with too numerous to count WBC.  urine culture with multiples bacteria, will send culture again.3-31 .  Continue with ceftriaxone.   3-Lateral right hemithorax with possible loculated effusion Vs Hematoma;  Needs repeat Chest x ray in 1 week.  Monitor hb, monitor for fever.  Patient afebrile.  CT chest negative.  discontinue azithromycin   4-frequent fall;  Fall precaution.  PT consulted.   thoracic and lumbar spine x ray. progression loss of high of compression fracture T 8. Discussed with neuro-sx, medical management for fracture. Patient can use Brace if she has significant pain.  Ct head diffuse atrophy   Code Status: Full Code.  Family Communication: care discussed with patient's son  Disposition Plan: remain inpatient , SNF when Bed available    Consultants:  none  Procedures:  none  Antibiotics:  Ceftriaxone  Azithromycin.   HPI/Subjective: Confuse, appears less agitated.     Objective: Filed Vitals:   10/08/15 2203 10/09/15 0706  BP: 149/70 140/88  Pulse: 74 72  Temp:  98.4 F (36.9 C)  Resp: 20 20    Intake/Output Summary (Last 24 hours) at 10/09/15 1533 Last data filed at 10/09/15 1248  Gross per 24 hour  Intake    550 ml  Output      0 ml  Net    550 ml   Filed Weights   10/06/15 0400 10/07/15 0654 10/09/15 0706  Weight: 62.7 kg (138 lb 3.7 oz) 63.5 kg (139 lb 15.9 oz) 61 kg (134 lb 7.7 oz)    Exam:   General:  Alert in no acute distress.   Cardiovascular: S 1, S 2  RRR  Respiratory: CTA  Abdomen: BS present,. Soft  nt  Musculoskeletal: no edema  Data Reviewed: Basic Metabolic Panel:  Recent Labs Lab 10/05/15 0921 10/06/15 0612 10/07/15 0554 10/08/15 0605  NA 142 143 140 144  K 4.0 4.0 3.6 3.5  CL 107 108 109 111  CO2 GLUCOSE 106* 99 102* 100*  BUN CREATININE 0.76 0.60 0.62 0.62  CALCIUM 8.7* 8.7* 7.9* 8.3*   Liver Function Tests:  Recent Labs Lab 10/05/15 0921 10/06/15 0612  AST 22 24  ALT 17 19  ALKPHOS 81 79  BILITOT 0.6 0.8  PROT 7.5 7.0  ALBUMIN 3.5 3.2*   No results for input(s): LIPASE, AMYLASE in the last 168 hours. No results for input(s): AMMONIA in the last 168 hours. CBC:  Recent Labs Lab 10/05/15 0921 10/06/15 0612 10/07/15 0554 10/08/15 0605  WBC 5.5 5.4 5.5 5.8  HGB 12.4 12.4 11.6* 12.2  HCT 38.8 38.1 36.0 36.9  MCV 93.9 93.4 94.0 92.9  PLT 233 241 229 229   Cardiac Enzymes: No results for input(s): CKTOTAL, CKMB, CKMBINDEX, TROPONINI in the last 168 hours. BNP (last 3 results) No results for input(s): BNP in the last 8760 hours.  ProBNP (last 3 results) No results for input(s): PROBNP in the last 8760 hours.  CBG: No results for input(s): GLUCAP in the last 168 hours.  Recent Results (from the past  240 hour(s))  Urine culture     Status: None   Collection Time: 10/05/15 10:44 AM  Result Value Ref Range Status   Specimen Description URINE, CLEAN CATCH  Final   Special Requests IMMUNE:COMPROMISED  Final   Culture   Final    MULTIPLE SPECIES PRESENT, SUGGEST RECOLLECTION Performed at [facility]    Report Status 10/06/2015 FINAL  Final  Urine culture     Status: None (Preliminary result)   Collection Time: 10/07/15  6:54 PM  Result Value Ref Range Status   Specimen Description URINE, RANDOM  Final   Special Requests NONE  Final   Culture   Final    TOO YOUNG TO READ Performed at [facility]    Report Status PENDING  Incomplete      Studies: No results found.  Scheduled Meds: . aspirin EC  81 mg Oral Daily  . cefTRIAXone (ROCEPHIN)  IV  1 g Intravenous Q24H  . docusate sodium  100 mg Oral BID  . donepezil  5 mg Oral QHS  . multivitamin with minerals  1 tablet Oral Daily  . OLANZapine  2.5 mg Oral QHS  . senna  1 tablet Oral BID  . sodium chloride flush  3 mL Intravenous Q12H  . tuberculin  5 Units Intradermal Once   Continuous Infusions: . sodium chloride 50 mL/hr at 10/07/15 1737    Active Problems:   Acute encephalopathy   Compression fracture of T12 vertebra (HCC)   Fall   UTI (lower urinary tract infection)    Time spent: 35 minutes.     Hartley Barefootegalado, Vinisha Faxon A  [facility] Pager [phone]. If 7PM-7AM, please contact night-coverage at [email or web address], password [email or web address] 10/09/2015, 3:33 PM  LOS: 4 days

## 2015-10-09 NOTE — Progress Notes (Signed)
Took pt's vitals. BP 171/67, HR 64; then retook in about 20 minutes BP 173/60, HR 67. Paged Dr. Sunnie Nielsenegalado for PRN blood pressure medication.

## 2015-10-10 LAB — BASIC METABOLIC PANEL
Anion gap: 8 (ref 5–15)
BUN: 15 mg/dL (ref 6–20)
CHLORIDE: 110 mmol/L (ref 101–111)
CO2: 24 mmol/L (ref 22–32)
CREATININE: 0.53 mg/dL (ref 0.44–1.00)
Calcium: 8.6 mg/dL — ABNORMAL LOW (ref 8.9–10.3)
GFR calc Af Amer: >60 mL/min (ref >60)
GFR calc non Af Amer: >60 mL/min (ref >60)
GLUCOSE: 100 mg/dL — AB (ref 65–99)
Potassium: 4 mmol/L (ref 3.5–5.1)
SODIUM: 142 mmol/L (ref 135–145)

## 2015-10-10 LAB — CBC
HCT: 39.4 % (ref 36.0–46.0)
HEMOGLOBIN: 13.2 g/dL (ref 12.0–15.0)
MCH: 30.7 pg (ref 26.0–34.0)
MCHC: 33.5 g/dL (ref 30.0–36.0)
MCV: 91.6 fL (ref 78.0–100.0)
Platelets: 241 10*3/uL (ref 150–400)
RBC: 4.3 MIL/uL (ref 3.87–5.11)
RDW: 13.3 % (ref 11.5–15.5)
WBC: 6.4 10*3/uL (ref 4.0–10.5)

## 2015-10-10 MED ORDER — OLANZAPINE 2.5 MG PO TABS: 2.5000 mg | ORAL_TABLET | Freq: Every day | ORAL | Status: AC

## 2015-10-10 MED ORDER — TUBERCULIN PPD 5 UNIT/0.1ML ID SOLN
5.0000 [IU] | Freq: Once | INTRADERMAL | Status: DC
  Administered 2015-10-10: 5 [IU] via INTRADERMAL
  Filled 2015-10-10: qty 0.1

## 2015-10-10 MED ORDER — CEPHALEXIN 500 MG PO CAPS: 500.0000 mg | ORAL_CAPSULE | Freq: Two times a day (BID) | ORAL | Status: DC

## 2015-10-10 MED ORDER — DOCUSATE SODIUM 100 MG PO CAPS: 100.0000 mg | ORAL_CAPSULE | Freq: Two times a day (BID) | ORAL | Status: AC

## 2015-10-10 MED ORDER — HALOPERIDOL 1 MG PO TABS: 1.0000 mg | ORAL_TABLET | Freq: Two times a day (BID) | ORAL | Status: AC | PRN

## 2015-10-10 MED ORDER — LORAZEPAM 0.5 MG PO TABS: 0.5000 mg | ORAL_TABLET | Freq: Two times a day (BID) | ORAL | Status: AC | PRN

## 2015-10-10 MED ORDER — TRAMADOL HCL 50 MG PO TABS
50.0000 mg | ORAL_TABLET | Freq: Two times a day (BID) | ORAL | Status: AC | PRN
Start: 2015-10-10 — End: ?

## 2015-10-10 NOTE — Progress Notes (Signed)
TB injection given right anterior forearm.

## 2015-10-10 NOTE — Progress Notes (Addendum)
2:46 PM LCSW met with Tracy Mccoy from [facility] to review patient for possible admission to SCU/ALF.   Patient remains sedated and assessment is unable to be completed. Patient needs to be walked or PT ordered to define current baseline for admission to ALF.   Patient needs to be independent, limited assist, or supervision for level of care in SCU.   Assessment could not be completed as patient will not open eyes, engage in any type of response, and laying in bed with mitts on. Will discuss with RN and PT. Discussed with CM.     LCSW spoke with [facility] ALF/SCU regarding referral and assessment completed on Friday.  Due to patient being heavily sedated, [facility] was unable to complete accurate assessment. They will be back out today to complete per Administrator. LCSW will await review of patient and follow up pending bed offer for ALF.  Lane Hacker, MSW Clinical Social Work: [facility] [employer] [phone]

## 2015-10-10 NOTE — Progress Notes (Signed)
OT Cancellation Note  Patient Details Name: Tracy Mccoy MRN: [id number] DOB: 1923/03/04   Cancelled Treatment:     Reason evaluation not completed: Reason Eval/Treat Not Completed: Pt screened, no needs identified, will sign off. Chart reviewed & discussed with PT. Pt has moderate-severe dementia at baseline with behavioral disturbances.  Pt with limited ability to participate in rehab services with retention to improve her functioning due to severity of dementia. Pt requires 24/7 supervision at baseline for participation and safety during ADL tasks. CSW is seeking placement in ALF/LTC due to pt level of care required. No further acute OT needs at this time, pt will require 24/7 supervision for safety and ADL completion.    Ezra SitesLeslie Yamna Mackel, OTR/L  [phone]  10/10/2015, 8:03 AM

## 2015-10-10 NOTE — Progress Notes (Signed)
[facility] PROGRESS NOTE  Tracy MazeMarie Mccoy ZOX:[id number] DOB: Feb 09, 1923 DOA: 10/05/2015 PCP: No primary care provider on file.  Assessment/Plan: 1-Acute encephalopathy;  Has baseline dementia with behavior problems. More agitated suspect related to infection.  CT head negative Treating  for UTI. Urine culture growing many bacteria, will send culture again.  Will continue with zyprexa.  Sitter at bedside.  Haldol PRN helps/.  Tapering ativan.   2-UTI; UA with too numerous to count WBC.  urine culture with multiples bacteria, awaiting re-culture.  Continue with ceftriaxone.   3-Lateral right hemithorax with possible loculated effusion Vs Hematoma;  Needs repeat Chest x ray in 1 week.  Monitor hb, monitor for fever.  Patient afebrile.  CT chest negative.  discontinue azithromycin   4-frequent fall;  Fall precaution.  PT consulted.   thoracic and lumbar spine x ray. progression loss of high of compression fracture T 8. Discussed with neuro-sx, medical management for fracture. Patient can use Brace if she has significant pain.  Ct head diffuse atrophy   Code Status: Full Code.  Family Communication: care discussed with patient's son  Disposition Plan: remain inpatient , SNF when Bed available    Consultants:  none  Procedures:  none  Antibiotics:  Ceftriaxone  Azithromycin.   HPI/Subjective: Confuse, appears less agitated. No new complaints.     Objective: Filed Vitals:   10/10/15 0600 10/10/15 1300  BP: 158/63 116/44  Pulse: 80 82  Temp: 97.8 F (36.6 C) 97.8 F (36.6 C)  Resp:  20    Intake/Output Summary (Last 24 hours) at 10/10/15 1413 Last data filed at 10/10/15 1100  Gross per 24 hour  Intake    240 ml  Output      0 ml  Net    240 ml   Filed Weights   10/06/15 0400 10/07/15 0654 10/09/15 0706  Weight: 62.7 kg (138 lb 3.7 oz) 63.5 kg (139 lb 15.9 oz) 61 kg (134 lb 7.7 oz)    Exam:   General:  Alert in no acute distress.    Cardiovascular: S 1, S 2 RRR  Respiratory: CTA  Abdomen: BS present,. Soft  nt  Musculoskeletal: no edema  Data Reviewed: Basic Metabolic Panel:  Recent Labs Lab 10/05/15 0921 10/06/15 0612 10/07/15 0554 10/08/15 0605 10/10/15 0558  NA 142 143 140 144 142  K 4.0 4.0 3.6 3.5 4.0  CL 107 108 109 111 110  CO2 27 25 25 25 24   GLUCOSE 106* 99 102* 100* 100*  BUN 15 12 14 10 15   CREATININE 0.76 0.60 0.62 0.62 0.53  CALCIUM 8.7* 8.7* 7.9* 8.3* 8.6*   Liver Function Tests:  Recent Labs Lab 10/05/15 0921 10/06/15 0612  AST 22 24  ALT 17 19  ALKPHOS 81 79  BILITOT 0.6 0.8  PROT 7.5 7.0  ALBUMIN 3.5 3.2*   No results for input(s): LIPASE, AMYLASE in the last 168 hours. No results for input(s): AMMONIA in the last 168 hours. CBC:  Recent Labs Lab 10/05/15 0921 10/06/15 0612 10/07/15 0554 10/08/15 0605 10/10/15 0558  WBC 5.5 5.4 5.5 5.8 6.4  HGB 12.4 12.4 11.6* 12.2 13.2  HCT 38.8 38.1 36.0 36.9 39.4  MCV 93.9 93.4 94.0 92.9 91.6  PLT 233 241 229 229 241   Cardiac Enzymes: No results for input(s): CKTOTAL, CKMB, CKMBINDEX, TROPONINI in the last 168 hours. BNP (last 3 results) No results for input(s): BNP in the last 8760 hours.  ProBNP (last 3 results) No results for input(s): PROBNP  in the last 8760 hours.  CBG: No results for input(s): GLUCAP in the last 168 hours.  Recent Results (from the past 240 hour(s))  Urine culture     Status: None   Collection Time: 10/05/15 10:44 AM  Result Value Ref Range Status   Specimen Description URINE, CLEAN CATCH  Final   Special Requests IMMUNE:COMPROMISED  Final   Culture   Final    MULTIPLE SPECIES PRESENT, SUGGEST RECOLLECTION Performed at [facility]    Report Status 10/06/2015 FINAL  Final  Urine culture     Status: None (Preliminary result)   Collection Time: 10/07/15  6:54 PM  Result Value Ref Range Status   Specimen Description URINE, RANDOM  Final   Special Requests NONE  Final   Culture    Final    TOO YOUNG TO READ Performed at [facility]    Report Status PENDING  Incomplete     Studies: No results found.  Scheduled Meds: . aspirin EC  81 mg Oral Daily  . cefTRIAXone (ROCEPHIN)  IV  1 g Intravenous Q24H  . docusate sodium  100 mg Oral BID  . donepezil  5 mg Oral QHS  . multivitamin with minerals  1 tablet Oral Daily  . OLANZapine  2.5 mg Oral QHS  . senna  1 tablet Oral BID  . sodium chloride flush  3 mL Intravenous Q12H  . tuberculin  5 Units Intradermal Once   Continuous Infusions: . sodium chloride 50 mL/hr at 10/07/15 1737    Active Problems:   Acute encephalopathy   Compression fracture of T12 vertebra (HCC)   Fall   UTI (lower urinary tract infection)    Time spent: 35 minutes.     Hartley Barefoot A  [facility] Pager [phone]. If 7PM-7AM, please contact night-coverage at [email or web address], password [facility] 10/10/2015, 2:13 PM  LOS: 5 days

## 2015-10-11 DIAGNOSIS — G9340 Encephalopathy, unspecified: Secondary | ICD-10-CM

## 2015-10-11 DIAGNOSIS — M4854XA Collapsed vertebra, not elsewhere classified, thoracic region, initial encounter for fracture: Secondary | ICD-10-CM

## 2015-10-11 DIAGNOSIS — N390 Urinary tract infection, site not specified: Principal | ICD-10-CM

## 2015-10-11 MED ORDER — AMOXICILLIN 250 MG PO CAPS
250.0000 mg | ORAL_CAPSULE | Freq: Three times a day (TID) | ORAL | Status: DC
  Administered 2015-10-11: 250 mg via ORAL
  Filled 2015-10-11: qty 1

## 2015-10-11 MED ORDER — AMOXICILLIN 250 MG PO CAPS: 250.0000 mg | ORAL_CAPSULE | Freq: Three times a day (TID) | ORAL | Status: AC

## 2015-10-11 NOTE — Clinical Social Work Placement (Signed)
   CLINICAL SOCIAL WORK PLACEMENT  NOTE  Date:  10/11/2015  Patient Details  Name: Tracy Mccoy MRN: [id number] Date of Birth: 1922/12/30  Clinical Social Work is seeking post-discharge placement for this patient at the Assisted Living Facility (Special Care Unit) level of care (*CSW will initial, date and re-position this form in  chart as items are completed):  Yes   Patient/family provided with [facility] Clinical Social Work Department's list of facilities offering this level of care within the geographic area requested by the patient (or if unable, by the patient's family).  Yes   Patient/family informed of their freedom to choose among providers that offer the needed level of care, that participate in Medicare, Medicaid or managed care program needed by the patient, have an available bed and are willing to accept the patient.  Yes   Patient/family informed of [facility]'s ownership interest in [facility] and [facility], as well as of the fact that they are under no obligation to receive care at these facilities.  PASRR submitted to EDS on 10/07/15     PASRR number received on 10/07/15     Existing PASRR number confirmed on       FL2 transmitted to all facilities in geographic area requested by pt/family on 10/07/15     FL2 transmitted to all facilities within larger geographic area on       Patient informed that his/her managed care company has contracts with or will negotiate with certain facilities, including the following:        Yes   Patient/family informed of bed offers received.  [facility]  Physician recommends and patient chooses bed at     ALF Patient to be transferred to   on  .  10/11/2015   Patient to be transferred to facility by     [facility]   Patient family notified on   of transfer.  Sharene ButtersGeorge POA, Niece Gar Gibbonnne Delainee, son: Renae Ficklepaul.  Name of family member notified:        PHYSICIAN Please sign FL2      Additional Comment:   FL2 updated. _______________________________________________ Raye Sorrowoble, Maveric Debono N, LCSW 10/11/2015, 11:32 AM

## 2015-10-11 NOTE — Care Management Note (Signed)
Case Management Note  Patient Details  Name: Tracy MazeMarie Mccoy MRN: [id number] Date of Birth: 1923/06/12  Subjective/Objective:                  Pt is from home, unable to return. Pt/family has worked with CSW to make arrangements for ALF placement. PT has recommended HH PT. [facility]. [facility], family is agreeable.   Action/Plan: [facility]. Pt [facility]. [facility]. No further CM needs.   Expected Discharge Date:    10/11/2015              Expected Discharge Plan:  Assisted Living / Rest Home  In-House Referral:  Clinical Social Work  Discharge planning Services  CM Consult  Post Acute Care Choice:  Home Health Choice offered to:  NA  DME Arranged:    DME Agency:     HH Arranged:  PT HH Agency[facility]  Status of Service:  Completed, signed off  Medicare Important Message Given:  Yes Date Medicare IM Given:    Medicare IM give by:    Date Additional Medicare IM Given:    Additional Medicare Important Message give by:     If discussed at Long Length of Stay Meetings, dates discussed:  10/11/2015  Additional Comments:  Malcolm MetroChildress, Ames Hoban Demske, RN 10/11/2015, 2:48 PM

## 2015-10-11 NOTE — Discharge Summary (Signed)
Physician Discharge Summary  Tracy Mccoy UEA:[id number] DOB: 01/18/1923 DOA: 10/05/2015  PCP: No primary care provider on file.  Admit date: 10/05/2015 Discharge date: 10/11/2015  Time spent: 20 minutes  Recommendations for Outpatient Follow-up:  1. Follow up with PCP in 2-3 weeks 2. Patient to continue on 5 days of amoxicillin, through 4/9 3. Repeat CXR in 1 week  Discharge Diagnoses:  Active Problems:   Acute encephalopathy   Compression fracture of T12 vertebra (HCC)   Fall   UTI (lower urinary tract infection)   Discharge Condition: Stable  Diet recommendation: Regular, thin liquids  Filed Weights   10/06/15 0400 10/07/15 0654 10/09/15 0706  Weight: 62.7 kg (138 lb 3.7 oz) 63.5 kg (139 lb 15.9 oz) 61 kg (134 lb 7.7 oz)    History of present illness:  Please review dictated H and P from 3/29 for details. Briefly, 82 y.o. female with PMH significant for dementia with behavior problems, frequent fall, she was discharge [facility]-2017 and treated for encephalopathy thought to be related to infection (pna) and medications (ambien, Norco), she was discharge to SNF, from which patient was release 2 weeks ago. Pt presented with increased confusion  Hospital Course:  1-Acute encephalopathy;  Has baseline dementia with behavior problems. Presented more agitated suspect related to infection.  CT head negative Treating for UTI (see below). Will continue with zyprexa.  Sitter at bedside.  Haldol PRN helps/.  Tapering ativan.   2-UTI; UA with too numerous to count WBC.  Initial urine culture with multiples bacteria, re-culture with 50,000 enterococcus species Had initially been continued on ceftriaxone. Given culture results, transition to amoxicillin.  3-Lateral right hemithorax with possible loculated effusion Vs Hematoma;  Needs repeat Chest x ray in 1 week.  Patient afebrile.  CT chest negative. discontinued azithromycin   4-frequent  fall;  Fall precaution.  PT was consulted.  thoracic and lumbar spine x ray. progression loss of high of compression fracture T 8. Discussed with neuro-sx, medical management for fracture. Patient can use Brace if she has significant pain.  Ct head diffuse atrophy   Discharge Exam: Filed Vitals:   10/10/15 0600 10/10/15 1300 10/10/15 2046 10/11/15 0637  BP: 158/63 116/44 149/67 138/74  Pulse: 80 82 80 84  Temp: 97.8 F (36.6 C) 97.8 F (36.6 C) 97.5 F (36.4 C) 97.8 F (36.6 C)  TempSrc: Axillary Oral Oral Oral  Resp:  Height:      Weight:      SpO2: 96%  92% 93%    General: Awake, in nad Cardiovascular: regular, s1, s2 Respiratory: normal resp effort, no wheezing  Discharge Instructions     Medication List    STOP taking these medications        ALPRAZolam 1 MG tablet  Commonly known as:  XANAX     HYDROcodone-acetaminophen 5-325 MG tablet  Commonly known as:  NORCO/VICODIN     zolpidem 5 MG tablet  Commonly known as:  AMBIEN      TAKE these medications        aspirin EC 81 MG tablet  Take 81 mg by mouth daily.     cephALEXin 500 MG capsule  Commonly known as:  KEFLEX  Take 1 capsule (500 mg total) by mouth 2 (two) times daily.     docusate sodium 100 MG capsule  Commonly known as:  COLACE  Take 1 capsule (100 mg total) by mouth 2 (two) times daily.  donepezil 5 MG tablet  Commonly known as:  ARICEPT  Take 5 mg by mouth at bedtime.     haloperidol 1 MG tablet  Commonly known as:  HALDOL  Take 1 tablet (1 mg total) by mouth 2 (two) times daily as needed for agitation.     LORazepam 0.5 MG tablet  Commonly known as:  ATIVAN  Take 1 tablet (0.5 mg total) by mouth 2 (two) times daily as needed for anxiety.     OLANZapine 2.5 MG tablet  Commonly known as:  ZYPREXA  Take 1 tablet (2.5 mg total) by mouth at bedtime.     OVER THE COUNTER MEDICATION  Take 1 tablet by mouth daily. Memory Boost     traMADol 50 MG tablet  Commonly  known as:  ULTRAM  Take 1 tablet (50 mg total) by mouth every 12 (twelve) hours as needed for moderate pain.     TURMERIC PO  Take 538 mg by mouth 2 (two) times daily.       No Known Allergies    The results of significant diagnostics from this hospitalization (including imaging, microbiology, ancillary and laboratory) are listed below for reference.    Significant Diagnostic Studies: Dg Thoracic Spine 2 View  10/05/2015  CLINICAL DATA:  Back pain after falling 2 weeks ago. Dementia with combativeness. EXAM: THORACIC SPINE 2 VIEWS COMPARISON:  Portable chest 10/05/2015. Thoracic spine radiographs 08/23/2015. FINDINGS: The bones are diffusely demineralized. There are 12 rib-bearing thoracic type vertebral bodies. The T8 compression deformity has progressed over the last 6 weeks, now with approximately 75% loss of vertebral body height. There is no widening of the interpedicular distance, although there may be a small amount of paraspinal hemorrhage. The inferior endplate compression deformity at T11 is unchanged. No other fractures are seen. The alignment is stable. IMPRESSION: Progressive loss of height at T8 compression deformity since prior radiographs of 6 weeks ago. Stable T11 compression deformity. Electronically Signed   By: Carey Bullocks M.D.   On: 10/05/2015 17:28   Dg Lumbar Spine 2-3 Views  10/05/2015  CLINICAL DATA:  Back pain after falling 2 weeks ago. Dementia with combativeness. EXAM: LUMBAR SPINE - 2-3 VIEW COMPARISON:  Thoracic radiographs today. Lumbar spine radiographs 09/01/2015. FINDINGS: The bones are demineralized. There are 5 lumbar type vertebral bodies. The alignment is stable with a mild convex right scoliosis and grade 1 degenerative anterolisthesis at L4-5. No evidence of acute fracture or pars defect. Facet degenerative changes are present inferiorly. The disc spaces are mildly narrowed at L3-4 and L4-5. IMPRESSION: No acute lumbar spine findings. Electronically  Signed   By: Carey Bullocks M.D.   On: 10/05/2015 17:29   Ct Head Wo Contrast  10/05/2015  CLINICAL DATA:  Increasing confusion following fall 2 weeks prior EXAM: CT HEAD WITHOUT CONTRAST TECHNIQUE: Contiguous axial images were obtained from the base of the skull through the vertex without intravenous contrast. COMPARISON:  Head CT August 27, 2015; brain MRI August 28, 2015 FINDINGS: Mild diffuse atrophy is stable. There is no intracranial mass hemorrhage, extra-axial fluid collection, or midline shift. There is slight small vessel disease in the centra semiovale bilaterally, stable. Elsewhere gray-white compartments appear normal. No acute infarct evident. The bony calvarium appears intact. The mastoid air cells are clear. No intraorbital lesions are apparent. IMPRESSION: Mild diffuse atrophy with mild periventricular small vessel disease. No intracranial mass, hemorrhage, or acute appearing infarct. Electronically Signed   By: Bretta Bang III M.D.   On: 10/05/2015  17:10   Ct Chest W Contrast  10/06/2015  CLINICAL DATA:  Possible pleural effusion EXAM: CT CHEST WITH CONTRAST TECHNIQUE: Multidetector CT imaging of the chest was performed during intravenous contrast administration. CONTRAST:  75mL OMNIPAQUE IOHEXOL 300 MG/ML  SOLN COMPARISON:  10/05/2015 FINDINGS: The lungs are well aerated bilaterally. A minimal amount of dependent atelectasis is noted on the left. Some subpleural fat is noted bilaterally. The changes seen on the recent chest x-ray laterally on the right are related to prior rib deformity and mild pleural thickening. No loculated fluid collection is seen. No focal infiltrate is noted. The thoracic inlet is within normal limits. The thoracic aorta show some calcifications without aneurysmal dilatation or dissection. The pulmonary artery as visualized is within normal limits. No hilar or mediastinal adenopathy is seen. Large right renal cyst is noted arising from the upper pole  measuring 10 cm in greatest dimension. Small cyst is noted in the upper pole of the left kidney. Some cystic changes noted near the head of the pancreas which is incompletely evaluated on this examination. This measures approximately 2 cm. IMPRESSION: No effusion or infiltrate is noted to correspond with the abnormality seen on the recent chest x-ray. It is related to a combination of pleural thickening and patient rotation on the prior exam. Minimal dependent atelectasis in the left base. Renal cystic change. Cystic change near the head of the pancreas which is incompletely evaluated on this exam. The need for further workup can be determined on a clinical basis. Electronically Signed   By: Alcide Clever M.D.   On: 10/06/2015 19:16   Dg Chest Port 1 View  10/05/2015  CLINICAL DATA:  Altered mental status EXAM: PORTABLE CHEST 1 VIEW COMPARISON:  September 03, 2015 FINDINGS: There is opacification apparent pleural thickening along the inferolateral right hemithorax, new from 1 month prior. Lungs elsewhere are clear. Heart is borderline prominent with pulmonary vascularity within normal limits. No pneumothorax. No adenopathy. There is degenerative change in both shoulders. IMPRESSION: Suspect loculated effusion or possibly hematoma inferolateral right hemithorax. Atypical pneumonia in this area is possible but felt to be less likely. No fracture is evident in this area. No pneumothorax. Left lung is clear. Heart borderline prominent. Follow-up study in 7-10 days advised to further evaluate the opacity in the lateral right hemithorax. Electronically Signed   By: Bretta Bang III M.D.   On: 10/05/2015 14:09   Dg Hips Bilat With Pelvis 3-4 Views  10/05/2015  CLINICAL DATA:  Fall. EXAM: DG HIP (WITH OR WITHOUT PELVIS) 3-4V BILAT COMPARISON:  09/03/2015 bilateral hip and pelvic radiographs. FINDINGS: Diffuse osteopenia. No pelvic diastasis. No fracture or dislocation in the hips. No pelvic fracture. No suspicious  focal osseous lesions. Mild osteoarthritis in both hip joints. Degenerative changes in the visualized lower lumbar spine. IMPRESSION: No fracture.  No hip dislocation.  Diffuse osteopenia. Electronically Signed   By: Delbert Phenix M.D.   On: 10/05/2015 17:26    Microbiology: Recent Results (from the past 240 hour(s))  Urine culture     Status: None   Collection Time: 10/05/15 10:44 AM  Result Value Ref Range Status   Specimen Description URINE, CLEAN CATCH  Final   Special Requests IMMUNE:COMPROMISED  Final   Culture   Final    MULTIPLE SPECIES PRESENT, SUGGEST RECOLLECTION Performed at [facility]    Report Status 10/06/2015 FINAL  Final  Urine culture     Status: None (Preliminary result)   Collection Time: 10/07/15  6:54  PM  Result Value Ref Range Status   Specimen Description URINE, RANDOM  Final   Special Requests NONE  Final   Culture   Final    50,000 COLONIES/mL ENTEROCOCCUS SPECIES SUSCEPTIBILITIES TO FOLLOW Performed at [facility]    Report Status PENDING  Incomplete     Labs: Basic Metabolic Panel:  Recent Labs Lab 10/05/15 0921 10/06/15 0612 10/07/15 0554 10/08/15 0605 10/10/15 0558  NA 142 143 140 144 142  K 4.0 4.0 3.6 3.5 4.0  CL 107 108 109 111 110  CO2 27 25 25 25 24   GLUCOSE 106* 99 102* 100* 100*  BUN 15 12 14 10 15   CREATININE 0.76 0.60 0.62 0.62 0.53  CALCIUM 8.7* 8.7* 7.9* 8.3* 8.6*   Liver Function Tests:  Recent Labs Lab 10/05/15 0921 10/06/15 0612  AST 22 24  ALT 17 19  ALKPHOS 81 79  BILITOT 0.6 0.8  PROT 7.5 7.0  ALBUMIN 3.5 3.2*   No results for input(s): LIPASE, AMYLASE in the last 168 hours. No results for input(s): AMMONIA in the last 168 hours. CBC:  Recent Labs Lab 10/05/15 0921 10/06/15 0612 10/07/15 0554 10/08/15 0605 10/10/15 0558  WBC 5.5 5.4 5.5 5.8 6.4  HGB 12.4 12.4 11.6* 12.2 13.2  HCT 38.8 38.1 36.0 36.9 39.4  MCV 93.9 93.4 94.0 92.9 91.6  PLT 233 241 229 229 241   Cardiac  Enzymes: No results for input(s): CKTOTAL, CKMB, CKMBINDEX, TROPONINI in the last 168 hours. BNP: BNP (last 3 results) No results for input(s): BNP in the last 8760 hours.  ProBNP (last 3 results) No results for input(s): PROBNP in the last 8760 hours.  CBG: No results for input(s): GLUCAP in the last 168 hours.   Signed:  Demani Mcbrien, Scheryl MartenSTEPHEN K  [facility] 10/11/2015, 12:36 PM

## 2015-10-11 NOTE — Progress Notes (Addendum)
11:34 AM [facility]. FL2 updated with HH PT Family at bedside and agreeable/will sign patient into facilty. Patient will transport by facility at 1:30pm. POA called and updated. Son Renae Fickleaul to come and follow behind to facility. RN made aware. CM is arranging home health and updated. MD made aware and signing FL2 and DC Summary to be faxed once completed.  Patient continues to refuse medications, refuses to get up or do what RN and staff are asking. Son: [facility] GullyHCPOA has been contacted and notified that patient is medically stable for discharge and for her to be discharged to SCU, patient must have an assessment by SCU/ALF [facility].  [facility] attempted to assess to different times but barriers have been sedation and patient not responding. Son made aware that family must participate in treatment to encourage patient to get up and have her assessed as family is refusing to take care of her, but if no assessment can be completed, then she must go home.  Greggory StallionGeorge reports he will be calling his local brother Renae Fickleaul to come to the hospital.   Greggory StallionGeorge called back and reports he is unable to reach brother.  Brother has been called multiple times. Greggory StallionGeorge continues to report brother unable to be reached. Working to reach other family.  And will call LCSW back. Discussed case with LOS Quality Collaborative team.  Plan is DC today due to no medical interventions.  Home vs SCU. [facility] called and notified family:  RN to come see patient at hospital with family at bedside. Greggory StallionGeorge to call back with a time that Renae Fickleaul will be able to come to hospital and encourage patient to complete assessment.  LCSW following and will contact [facility] once call returned from [facility].  Deretha EmoryHannah Gerilynn Mccullars LCSW, MSW Clinical Social Work: [facility] [phone]

## 2015-10-11 NOTE — Evaluation (Signed)
Physical Therapy Evaluation Patient Details Name: Tracy MazeMarie File MRN: [id number] DOB: 08/20/1922 Today's Date: 10/11/2015   History of Present Illness  Tracy Mccoy is a 82yo white female with advanced dementia c behavioral disturbances [facility] worsening agitation, confusion, and multiple falls. [facility], and has a girthy falls history. PT is asked to evaluate the patient to assess indep in functional mobility for the purposes of DC planning.   Clinical Impression  At evaluation, pt is received semirecumbent in bed upon entry, niece present, who is instrumental in helping the patient participate in all activity during session. The pt is awake and participatory, but intellectually is unaware of the details of the situation in spite of verbal explanation. No acute distress noted at this time. The patient is pleasant, tangentially conversational, and having difficulty following simple commands. Frequent falls noted, with lumbar compression fracture noted. Pt global strength as screened during functional mobility assessment presents with mild to moderate impairment, the pt now requiring moderate physical assistance for bed mobility, and min assist for transfers. Pt performs gait with hand held assist with heavy verbal and visual cues.   Patient presenting with impairment of strength, balance, cognition, and activity tolerance, limiting ability to perform ADL and mobility tasks at  baseline level of function. Patient will benefit from supervision for all mobility and total care at DC to address the above impairments and limitations, in order to improve patient safety upon discharg, and to decrease falls risk. Granted pts history of dementia, inability to follow commands, and history of behavioral disturbances, pt is not appropriate for PT services at this time. Recommending continued mobility with caregivers to maintain current functional mobility.      Follow Up  Recommendations No PT follow up;Supervision for mobility/OOB;Supervision - Intermittent    Equipment Recommendations  None recommended by PT    Recommendations for Other Services       Precautions / Restrictions Precautions Precautions: None      Mobility  Bed Mobility Overal bed mobility: Needs Assistance Bed Mobility: Supine to Sit;Sit to Supine     Supine to sit: Mod assist Sit to supine: Mod assist   General bed mobility comments: Requires heavy verbal/visual cuing, easily confused; needs physical assistance for legs.   Transfers Overall transfer level: Needs assistance Equipment used: Rolling walker (2 wheeled) Transfers: Sit to/from Stand Sit to Stand: Min assist         General transfer comment: Requires heavy verbal/visual cuing, easily confused;   Ambulation/Gait Ambulation/Gait assistance: Min guard Ambulation Distance (Feet): 320 Feet Assistive device: 1 person hand held assist       General Gait Details: pauses every 12600ft to rest for 10 seconds then continues slowly. Difficulty navigating obstacles in hallway, high distractibility.   Stairs            Wheelchair Mobility    Modified Rankin (Stroke Patients Only)       Balance Overall balance assessment: History of Falls;Needs assistance                                           Pertinent Vitals/Pain Pain Assessment: No/denies pain    Home Living Family/patient expects to be discharged to:: Skilled nursing facility Living Arrangements: Children  Prior Function                 Hand Dominance   Dominant Hand: Right    Extremity/Trunk Assessment   Upper Extremity Assessment: Generalized weakness;Overall [facility] for tasks assessed           Lower Extremity Assessment: Generalized weakness;Overall WFL for tasks assessed         Communication   Communication: HOH  Cognition   Behavior During Therapy: [facility] for tasks  assessed/performed;Impulsive Overall Cognitive Status: History of cognitive impairments - at baseline       Memory: Decreased short-term memory;Decreased recall of precautions              General Comments      Exercises        Assessment/Plan    PT Assessment Patent does not need any further PT services  PT Diagnosis Difficulty walking;Abnormality of gait;Altered mental status   PT Problem List    PT Treatment Interventions     PT Goals (Current goals can be found in the Care Plan section) Acute Rehab PT Goals PT Goal Formulation: All assessment and education complete, DC therapy    Frequency     Barriers to discharge        Co-evaluation               End of Session Equipment Utilized During Treatment: Gait belt Activity Tolerance: Patient tolerated treatment well;Patient limited by fatigue;No increased pain Patient left: in bed;with family/visitor present;with bed alarm set;Other (comment) (safety mitten soff; towel sin lap for hand fixation.) Nurse Communication: Mobility status;Precautions         Time: 6045-4098 PT Time Calculation (min) (ACUTE ONLY): 22 min   Charges:   PT Evaluation $PT Eval Moderate Complexity: 1 Procedure PT Treatments $Therapeutic Activity: 8-22 mins   PT G Codes:        1:44 PM, Oct 29, 2015 Rosamaria Lints, PT, DPT PRN Physical Therapist at [facility]  License # [id number] [phone] (wireless)  [phone] (mobile)

## 2015-10-11 NOTE — Care Management Important Message (Signed)
Important Message  Patient Details  Name: Tracy MazeMarie Handler MRN: [id number] Date of Birth: September 25, 1922   Medicare Important Message Given:  Yes    Malcolm MetroChildress, Destynee Stringfellow Demske, RN 10/11/2015, 2:31 PM

## 2015-10-12 LAB — URINE CULTURE: Culture: 50000

## 2016-06-08 DEATH — deceased

## 2018-01-15 IMAGING — CT CT HEAD W/O CM
2 series · 15 of 30 positions shown, 19 images · non-contrast
Comparison: None.

CLINICAL DATA: Dementia. Patient fell on the fourteenth at home and
since then has had decreased appetite. Altered mental status.

EXAM:
CT HEAD WITHOUT CONTRAST
TECHNIQUE: Contiguous axial images were obtained from the base of the skull
through the vertex without intravenous contrast.

[Series 2: head w/o · axial · non-contrast · 0.45mm/px · z∈[-133,-13]mm · 13 of 29 slices shown, 17 images]
[im 3/29  brain]
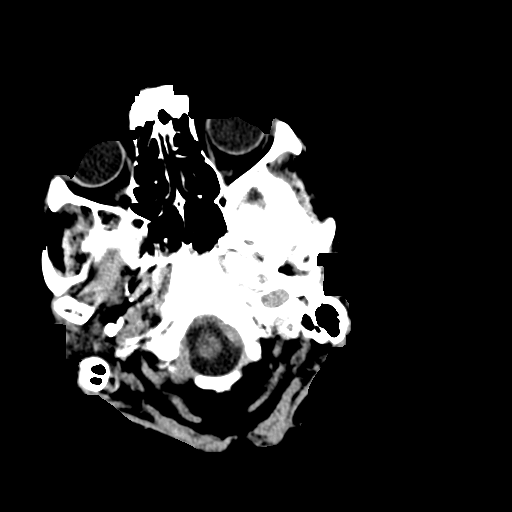
[im 3/29  bone]
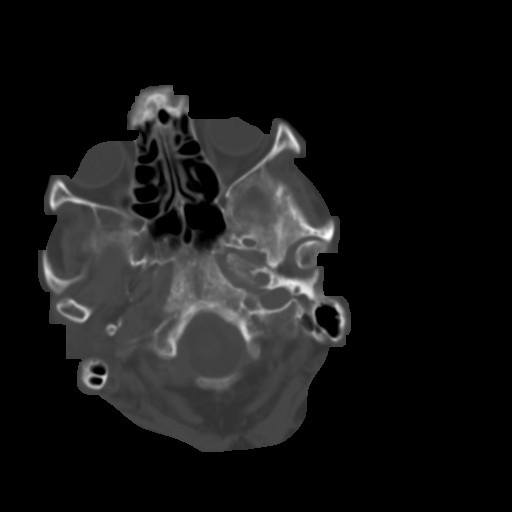
[im 5/29  brain]
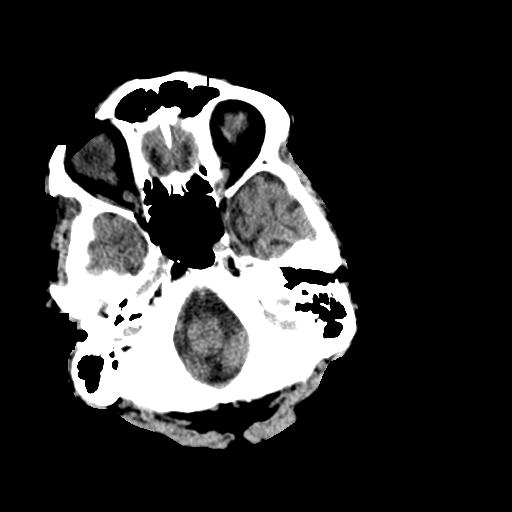
[im 7/29  brain]
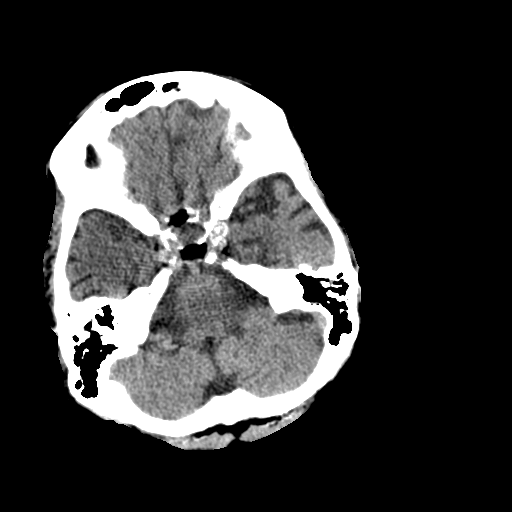
[im 9/29  brain]
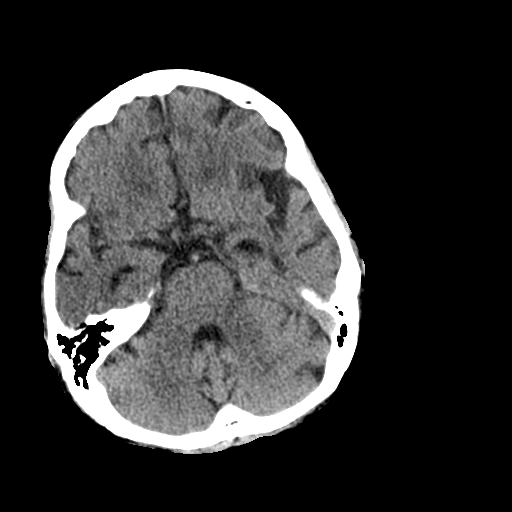
[im 11/29  brain]
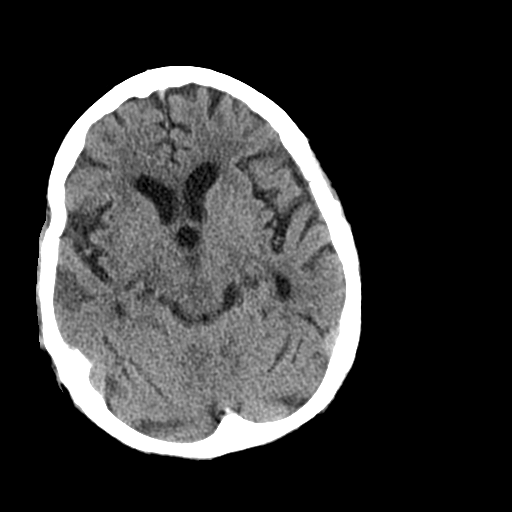
[im 11/29  bone]
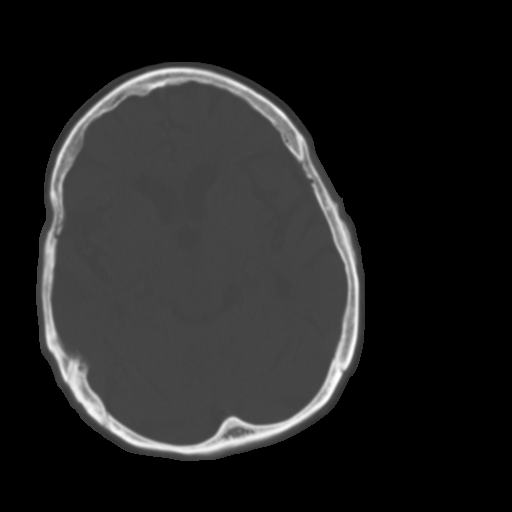
[im 13/29  brain]
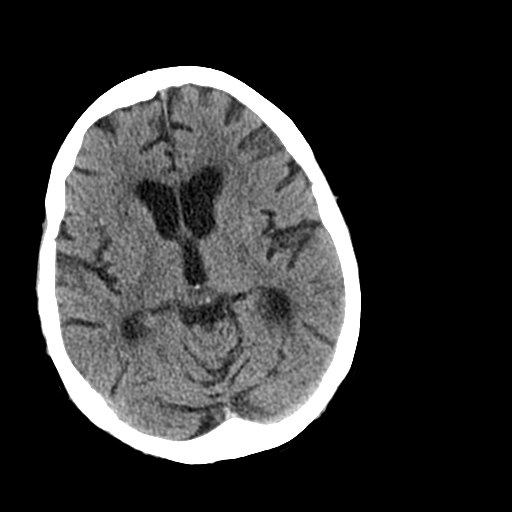
[im 15/29  brain]
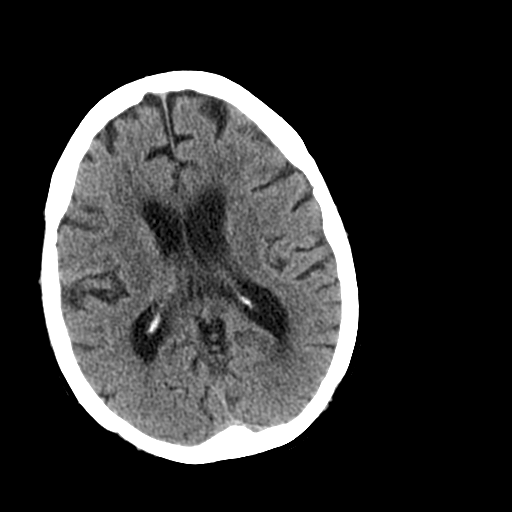
[im 17/29  brain]
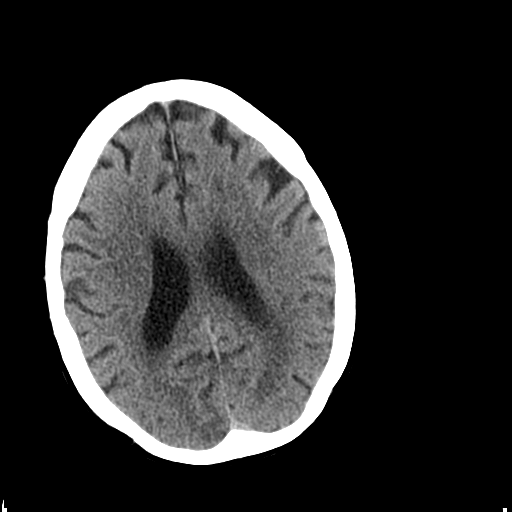
[im 19/29  brain]
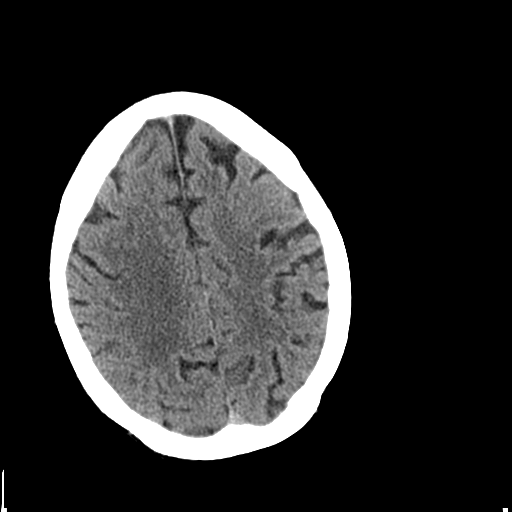
[im 19/29  bone]
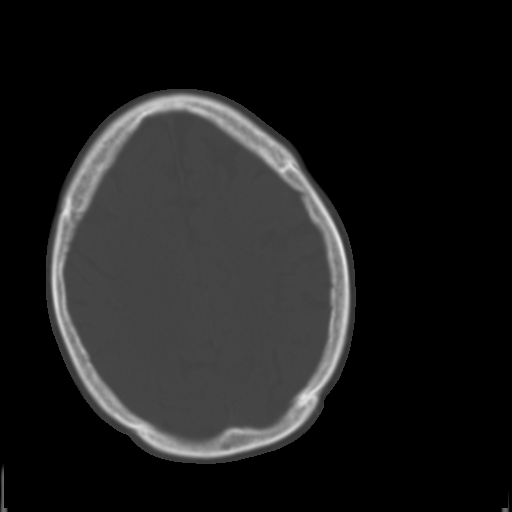
[im 21/29  brain]
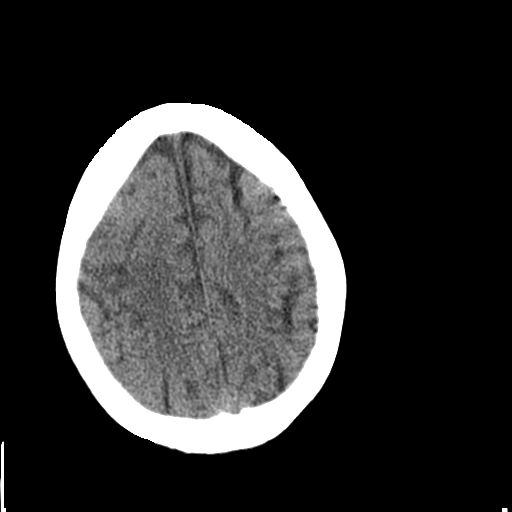
[im 23/29  brain]
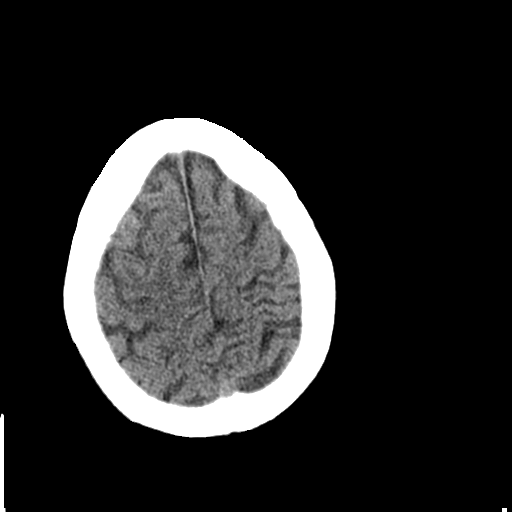
[im 25/29  brain]
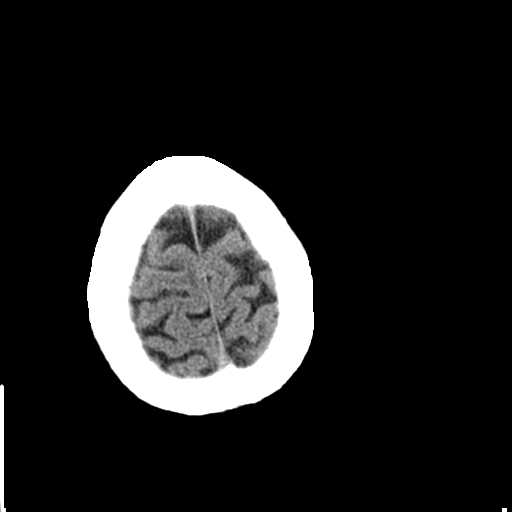
[im 27/29  brain]
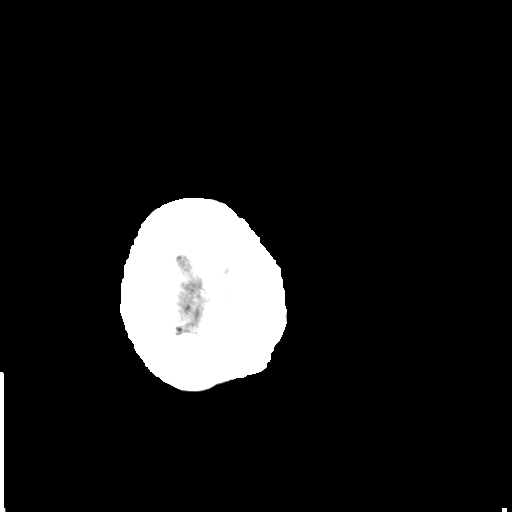
[im 27/29  bone]
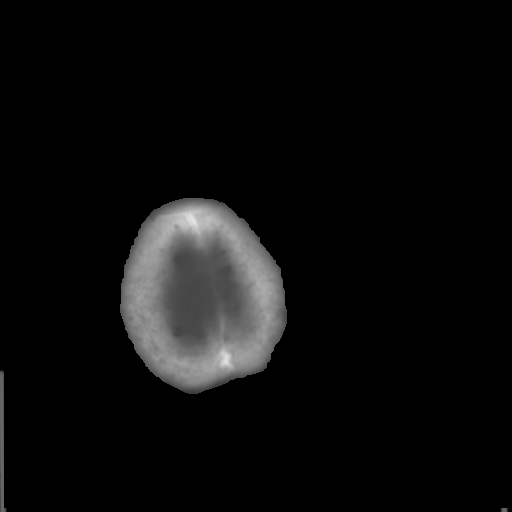

[Series 3: bone windows · axial · 0.45mm/px · z∈[-133,-113]mm · 2 of 29 slices shown]
[im 3/29  bone]
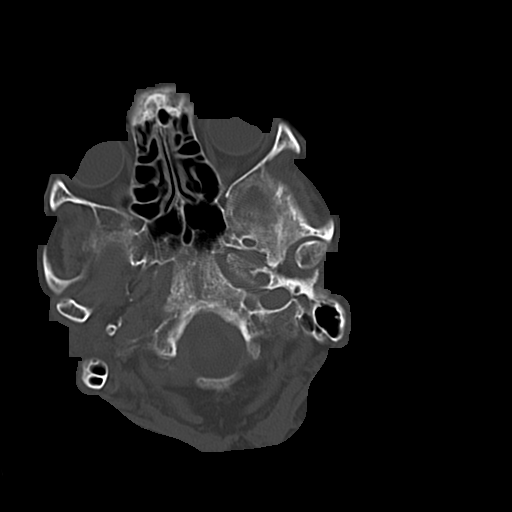
[im 7/29  bone]
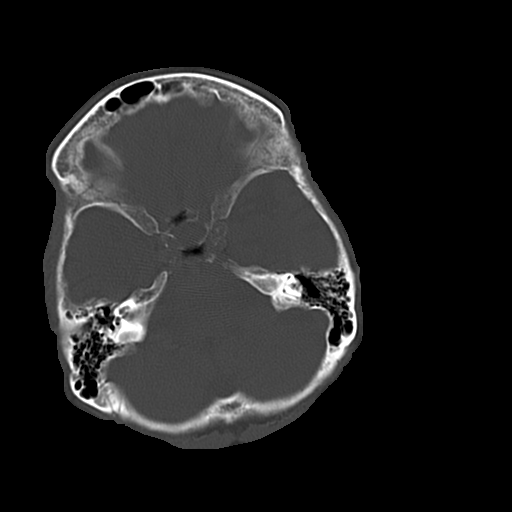

[15 of 30 positions shown; findings below may reference images not displayed]

FINDINGS: Diffuse cerebral atrophy. Mild ventricular dilatation consistent
with central atrophy. Low-attenuation changes in the deep white
matter consistent with small vessel ischemia. Somewhat hazy
appearance of the right posterior frontal sulci at the convexity.
This may be due to motion artifact but early changes of acute
infarct are not excluded. No mass effect or midline shift. No
abnormal extra-axial fluid collections. Gray-white matter junctions
are distinct. Basal cisterns are not effaced. No evidence of acute
intracranial hemorrhage. No depressed skull fractures. Visualized
paranasal sinuses and mastoid air cells are not opacified. Vascular
calcifications.
IMPRESSION: Chronic atrophy and small vessel ischemic changes. Nonspecific
changes in the right posterior frontal convexity may represent
artifact but early changes of acute infarct are not excluded. No
acute intracranial hemorrhage. No significant mass effect.

## 2018-01-15 IMAGING — DX DG CHEST 1V PORT
1 series · 1 of 1 positions shown · non-contrast
Comparison: [DATE]

CLINICAL DATA: Patient fell [REDACTED] at home with decreased
appetite since then, current history of thoracic compression
deformities

EXAM:
PORTABLE CHEST 1 VIEW

[chest ap]
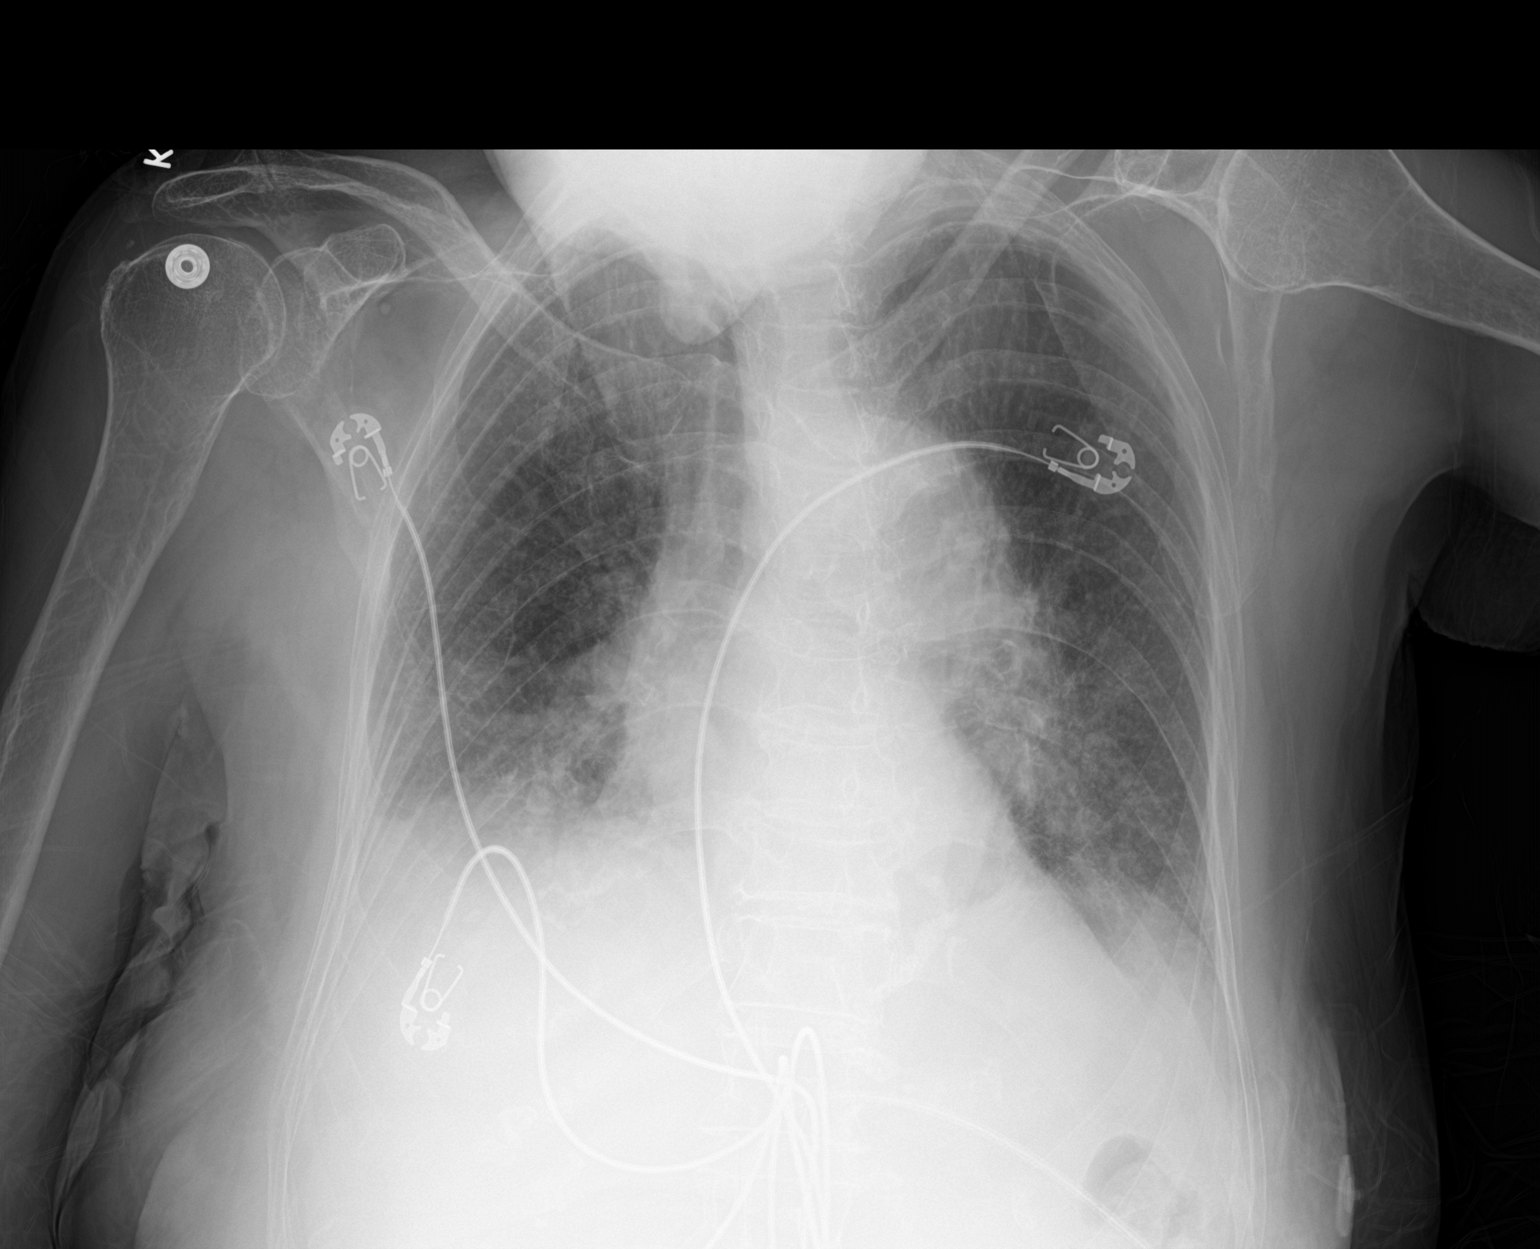

[1 of 1 positions shown; findings below may reference images not displayed]

FINDINGS: Moderate cardiac enlargement with significant uncoiling of the
aorta. Opacity right lower lobe suggesting consolidation and small
effusion. Vascular pattern within normal limits. No pleural effusion
on the left.
IMPRESSION: Right lower lobe airspace disease with small effusion. Pneumonia or
pneumonitis suspected.

## 2018-01-20 IMAGING — DX DG CERVICAL SPINE COMPLETE 4+V
6 series · 6 of 6 positions shown · non-contrast
Comparison: None.

CLINICAL DATA: Pain following fall

EXAM:
CERVICAL SPINE - COMPLETE 4+ VIEW

[c-spine lat]
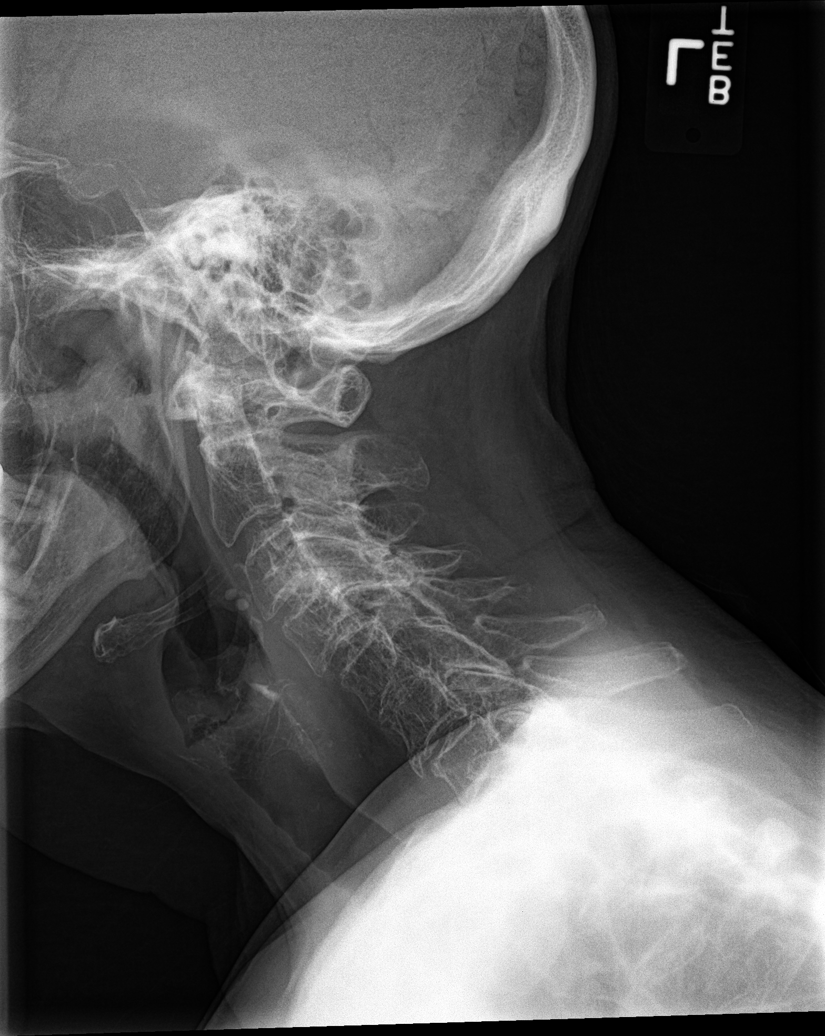

[c-spine obl (1 of 2)]
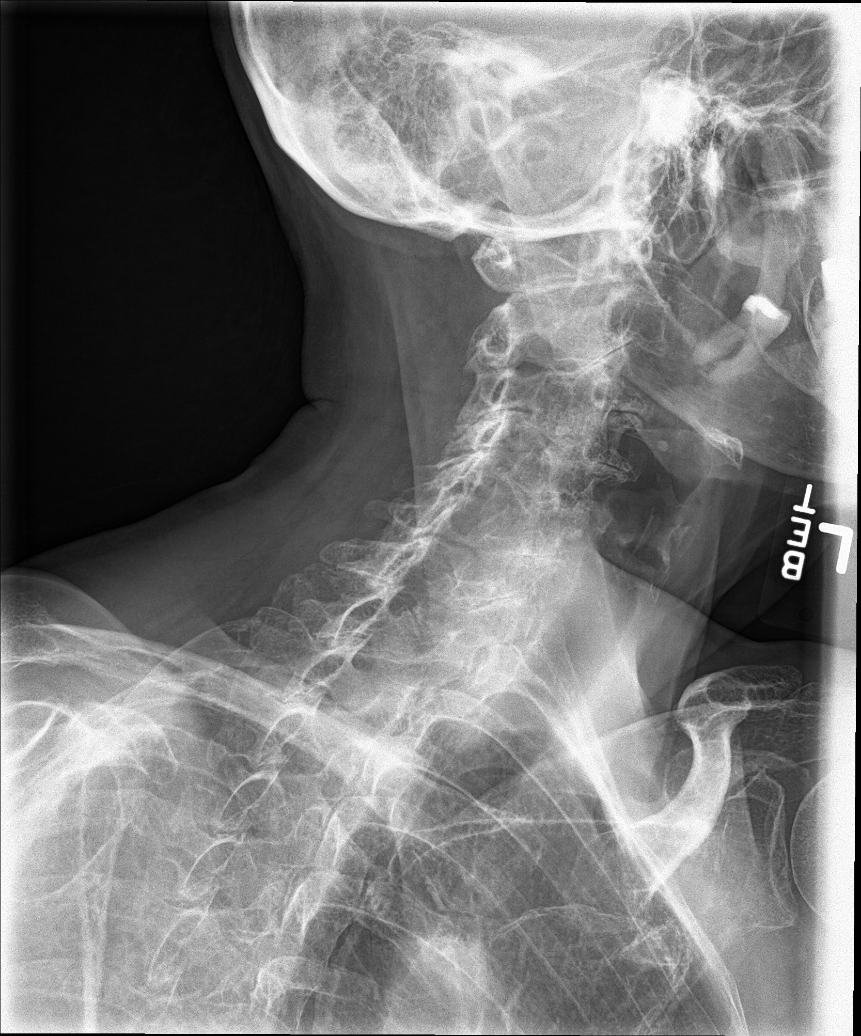

[c-spine obl (2 of 2)]
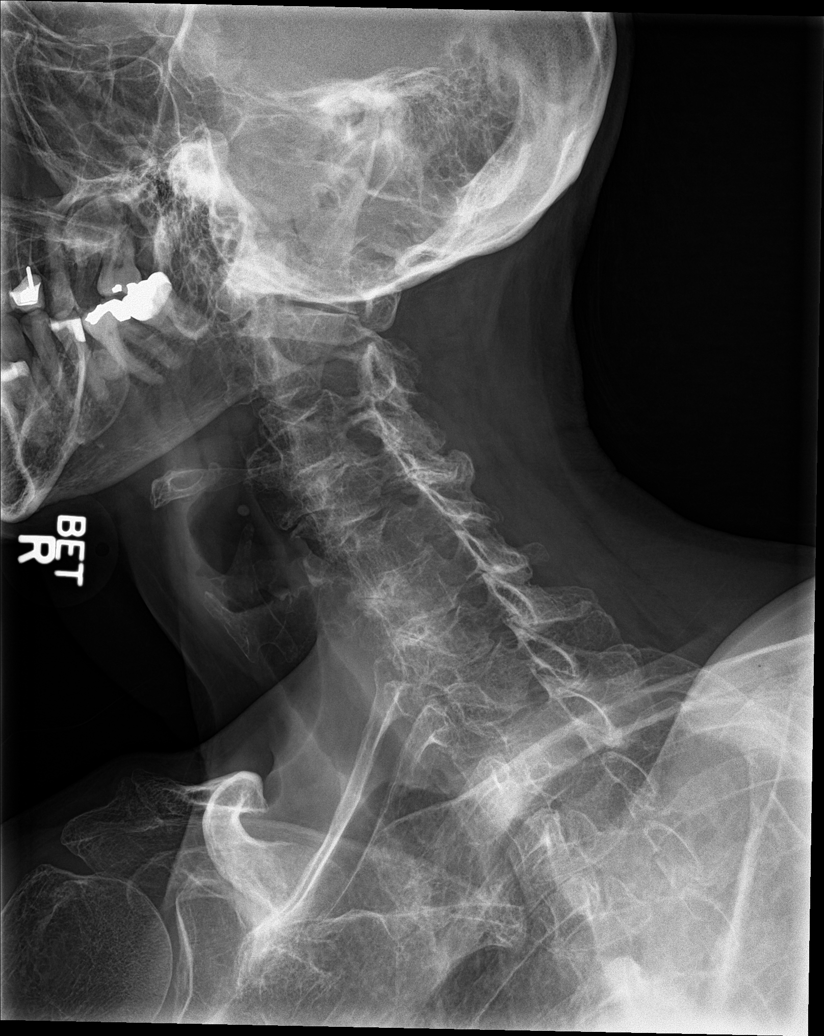

[c-spine ap]
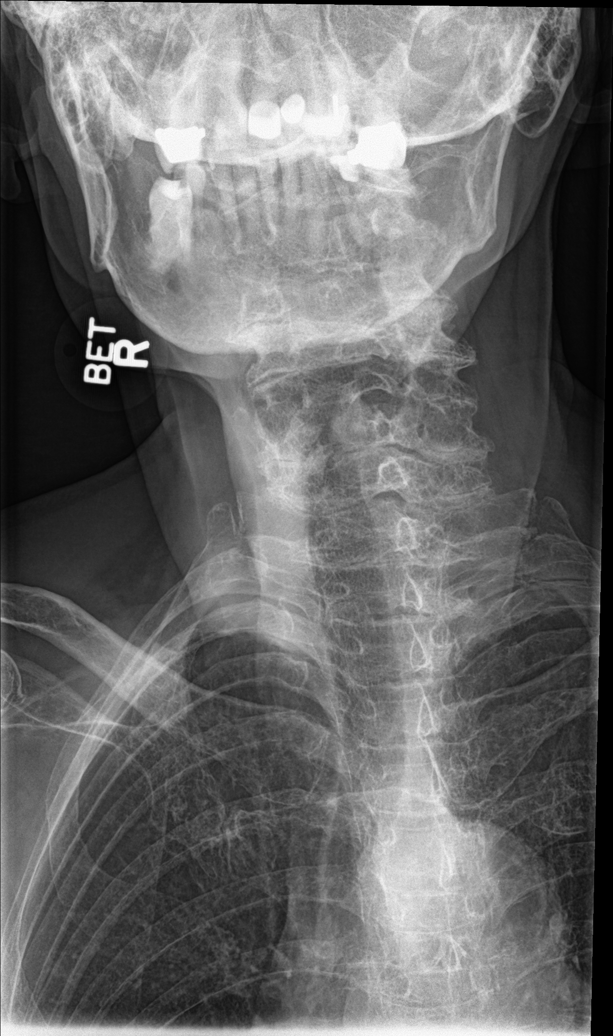

[c-spine open mouth (1 of 2)]
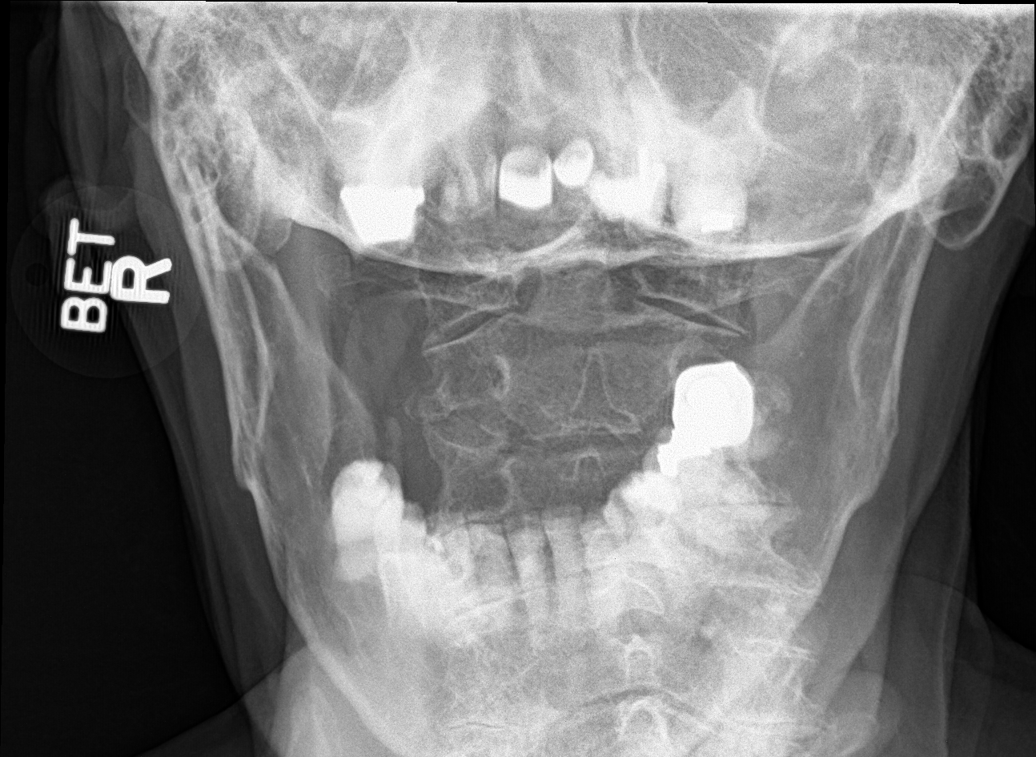

[c-spine open mouth (2 of 2)]
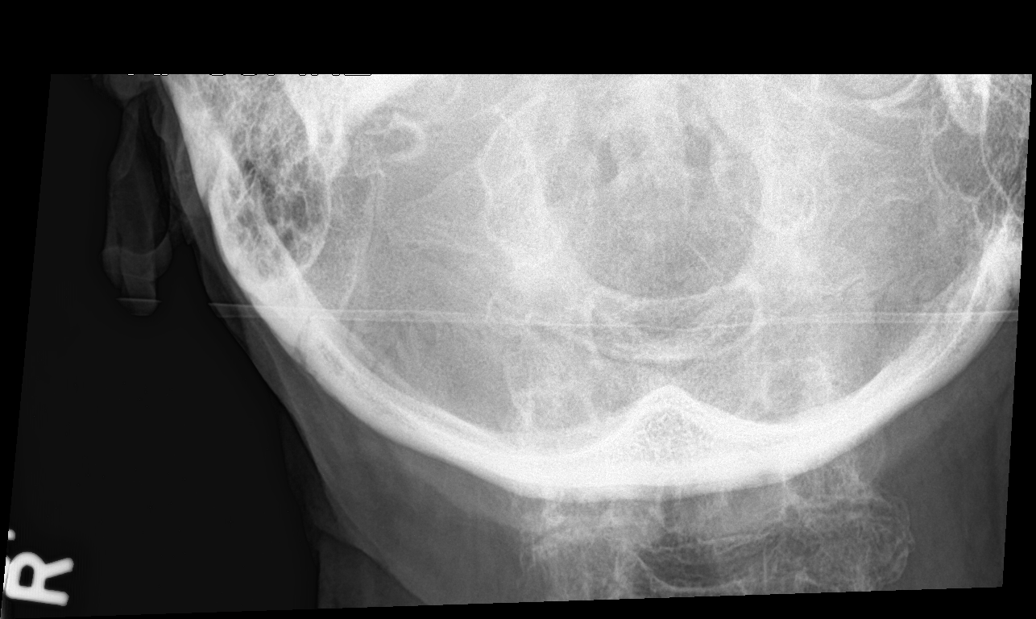

[6 of 6 positions shown; findings below may reference images not displayed]

FINDINGS: Frontal, lateral, open-mouth odontoid, and bilateral oblique views
were obtained. There is no demonstrable fracture. Slight
anterolisthesis of C4 on C5 is felt to be due to underlying
spondylosis. No other spondylolisthesis is evident. Prevertebral
soft tissues and predental space regions are normal. There is
moderately severe disc space narrowing at C5-6 with moderate
narrowing at C6-7. Slightly less narrowing is noted at C3-4 and
C4-5. There is facet hypertrophy with exit foraminal narrowing at
C3-4, C4-5 and C5-6 bilaterally on the oblique views. Bones appear
osteoporotic.
IMPRESSION: Multilevel arthropathy. Slight spondylolisthesis at C4-5 is felt to
be due to underlying spondylosis. No fracture evident. Bones
osteoporotic.
